# Patient Record
Sex: Male | Born: 1965 | Race: White | Hispanic: No | Marital: Single | Smoking: Current every day smoker
Health system: Southern US, Community
[De-identification: ages and names within clinical notes are randomized; demographics above are authoritative.]

## PROBLEM LIST (undated history)

## (undated) DIAGNOSIS — D696 Thrombocytopenia, unspecified: Secondary | ICD-10-CM

## (undated) DIAGNOSIS — K750 Abscess of liver: Secondary | ICD-10-CM

## (undated) DIAGNOSIS — K5730 Diverticulosis of large intestine without perforation or abscess without bleeding: Secondary | ICD-10-CM

## (undated) DIAGNOSIS — N1330 Unspecified hydronephrosis: Secondary | ICD-10-CM

## (undated) HISTORY — PX: ANTERIOR CRUCIATE LIGAMENT REPAIR: SHX115

## (undated) HISTORY — PX: OTHER SURGICAL HISTORY: SHX169

## (undated) HISTORY — PX: EXPLORATORY LAPAROTOMY: SUR591

---

## 2004-07-25 ENCOUNTER — Emergency Department (HOSPITAL_COMMUNITY): Admission: EM | Admit: 2004-07-25 | Discharge: 2004-07-25 | Payer: Self-pay | Admitting: *Deleted

## 2004-09-07 ENCOUNTER — Ambulatory Visit: Payer: Self-pay | Admitting: Family Medicine

## 2004-09-16 ENCOUNTER — Ambulatory Visit: Payer: Self-pay | Admitting: Professional

## 2004-09-23 ENCOUNTER — Ambulatory Visit: Payer: Self-pay | Admitting: Professional

## 2004-09-30 ENCOUNTER — Ambulatory Visit: Payer: Self-pay | Admitting: Professional

## 2004-10-07 ENCOUNTER — Ambulatory Visit: Payer: Self-pay | Admitting: Professional

## 2004-12-13 ENCOUNTER — Ambulatory Visit: Payer: Self-pay | Admitting: Professional

## 2004-12-17 ENCOUNTER — Ambulatory Visit: Payer: Self-pay | Admitting: Family Medicine

## 2004-12-31 ENCOUNTER — Ambulatory Visit (HOSPITAL_COMMUNITY): Admission: RE | Admit: 2004-12-31 | Discharge: 2004-12-31 | Payer: Self-pay | Admitting: Orthopedic Surgery

## 2004-12-31 ENCOUNTER — Ambulatory Visit (HOSPITAL_BASED_OUTPATIENT_CLINIC_OR_DEPARTMENT_OTHER): Admission: RE | Admit: 2004-12-31 | Discharge: 2004-12-31 | Payer: Self-pay | Admitting: Orthopedic Surgery

## 2005-01-17 ENCOUNTER — Encounter: Admission: RE | Admit: 2005-01-17 | Discharge: 2005-04-17 | Payer: Self-pay | Admitting: Orthopedic Surgery

## 2005-01-27 ENCOUNTER — Ambulatory Visit: Payer: Self-pay | Admitting: Professional

## 2005-02-03 ENCOUNTER — Ambulatory Visit: Payer: Self-pay | Admitting: Professional

## 2005-02-14 ENCOUNTER — Ambulatory Visit: Payer: Self-pay | Admitting: Professional

## 2005-03-10 ENCOUNTER — Ambulatory Visit: Payer: Self-pay | Admitting: Professional

## 2005-04-18 ENCOUNTER — Encounter: Admission: RE | Admit: 2005-04-18 | Discharge: 2005-07-17 | Payer: Self-pay | Admitting: Orthopedic Surgery

## 2005-04-28 ENCOUNTER — Ambulatory Visit: Payer: Self-pay | Admitting: Professional

## 2005-07-11 ENCOUNTER — Ambulatory Visit: Payer: Self-pay | Admitting: Professional

## 2005-08-15 HISTORY — PX: ORIF FEMUR FRACTURE: SHX2119

## 2006-03-05 ENCOUNTER — Inpatient Hospital Stay (HOSPITAL_COMMUNITY): Admission: EM | Admit: 2006-03-05 | Discharge: 2006-03-08 | Payer: Self-pay | Admitting: Emergency Medicine

## 2006-03-22 ENCOUNTER — Encounter: Admission: RE | Admit: 2006-03-22 | Discharge: 2006-03-29 | Payer: Self-pay | Admitting: Orthopedic Surgery

## 2006-05-15 ENCOUNTER — Encounter: Admission: RE | Admit: 2006-05-15 | Discharge: 2006-05-23 | Payer: Self-pay | Admitting: Orthopedic Surgery

## 2006-07-03 ENCOUNTER — Encounter: Admission: RE | Admit: 2006-07-03 | Discharge: 2006-07-27 | Payer: Self-pay | Admitting: Orthopedic Surgery

## 2015-09-16 DIAGNOSIS — D696 Thrombocytopenia, unspecified: Secondary | ICD-10-CM

## 2015-09-16 DIAGNOSIS — K750 Abscess of liver: Secondary | ICD-10-CM

## 2015-09-16 DIAGNOSIS — K5730 Diverticulosis of large intestine without perforation or abscess without bleeding: Secondary | ICD-10-CM

## 2015-09-16 HISTORY — DX: Thrombocytopenia, unspecified: D69.6

## 2015-09-16 HISTORY — DX: Diverticulosis of large intestine without perforation or abscess without bleeding: K57.30

## 2015-09-16 HISTORY — DX: Abscess of liver: K75.0

## 2015-10-08 ENCOUNTER — Encounter (HOSPITAL_BASED_OUTPATIENT_CLINIC_OR_DEPARTMENT_OTHER): Payer: Self-pay | Admitting: *Deleted

## 2015-10-08 ENCOUNTER — Inpatient Hospital Stay (HOSPITAL_BASED_OUTPATIENT_CLINIC_OR_DEPARTMENT_OTHER)
Admission: EM | Admit: 2015-10-08 | Discharge: 2015-10-14 | DRG: 871 | Disposition: A | Discharge status: Home Health | Payer: Commercial Managed Care - PPO | Attending: Internal Medicine | Admitting: Internal Medicine

## 2015-10-08 ENCOUNTER — Emergency Department (HOSPITAL_BASED_OUTPATIENT_CLINIC_OR_DEPARTMENT_OTHER): Payer: Commercial Managed Care - PPO

## 2015-10-08 DIAGNOSIS — F1010 Alcohol abuse, uncomplicated: Secondary | ICD-10-CM | POA: Diagnosis present

## 2015-10-08 DIAGNOSIS — B957 Other staphylococcus as the cause of diseases classified elsewhere: Secondary | ICD-10-CM | POA: Diagnosis not present

## 2015-10-08 DIAGNOSIS — R6883 Chills (without fever): Secondary | ICD-10-CM | POA: Diagnosis not present

## 2015-10-08 DIAGNOSIS — K6389 Other specified diseases of intestine: Secondary | ICD-10-CM

## 2015-10-08 DIAGNOSIS — R5383 Other fatigue: Secondary | ICD-10-CM

## 2015-10-08 DIAGNOSIS — E876 Hypokalemia: Secondary | ICD-10-CM | POA: Diagnosis present

## 2015-10-08 DIAGNOSIS — R059 Cough, unspecified: Secondary | ICD-10-CM | POA: Insufficient documentation

## 2015-10-08 DIAGNOSIS — B370 Candidal stomatitis: Secondary | ICD-10-CM | POA: Diagnosis present

## 2015-10-08 DIAGNOSIS — A409 Streptococcal sepsis, unspecified: Principal | ICD-10-CM | POA: Diagnosis present

## 2015-10-08 DIAGNOSIS — R05 Cough: Secondary | ICD-10-CM | POA: Diagnosis not present

## 2015-10-08 DIAGNOSIS — R935 Abnormal findings on diagnostic imaging of other abdominal regions, including retroperitoneum: Secondary | ICD-10-CM | POA: Diagnosis not present

## 2015-10-08 DIAGNOSIS — Z113 Encounter for screening for infections with a predominantly sexual mode of transmission: Secondary | ICD-10-CM | POA: Diagnosis not present

## 2015-10-08 DIAGNOSIS — D120 Benign neoplasm of cecum: Secondary | ICD-10-CM | POA: Diagnosis present

## 2015-10-08 DIAGNOSIS — Z6827 Body mass index (BMI) 27.0-27.9, adult: Secondary | ICD-10-CM

## 2015-10-08 DIAGNOSIS — R918 Other nonspecific abnormal finding of lung field: Secondary | ICD-10-CM | POA: Diagnosis present

## 2015-10-08 DIAGNOSIS — F17200 Nicotine dependence, unspecified, uncomplicated: Secondary | ICD-10-CM | POA: Diagnosis present

## 2015-10-08 DIAGNOSIS — E8809 Other disorders of plasma-protein metabolism, not elsewhere classified: Secondary | ICD-10-CM | POA: Diagnosis present

## 2015-10-08 DIAGNOSIS — R634 Abnormal weight loss: Secondary | ICD-10-CM | POA: Insufficient documentation

## 2015-10-08 DIAGNOSIS — D696 Thrombocytopenia, unspecified: Secondary | ICD-10-CM | POA: Diagnosis present

## 2015-10-08 DIAGNOSIS — D122 Benign neoplasm of ascending colon: Secondary | ICD-10-CM | POA: Diagnosis present

## 2015-10-08 DIAGNOSIS — K5732 Diverticulitis of large intestine without perforation or abscess without bleeding: Secondary | ICD-10-CM | POA: Diagnosis present

## 2015-10-08 DIAGNOSIS — B954 Other streptococcus as the cause of diseases classified elsewhere: Secondary | ICD-10-CM | POA: Diagnosis not present

## 2015-10-08 DIAGNOSIS — Z720 Tobacco use: Secondary | ICD-10-CM | POA: Diagnosis present

## 2015-10-08 DIAGNOSIS — J189 Pneumonia, unspecified organism: Secondary | ICD-10-CM | POA: Diagnosis present

## 2015-10-08 DIAGNOSIS — K5730 Diverticulosis of large intestine without perforation or abscess without bleeding: Secondary | ICD-10-CM | POA: Diagnosis not present

## 2015-10-08 DIAGNOSIS — E871 Hypo-osmolality and hyponatremia: Secondary | ICD-10-CM | POA: Diagnosis present

## 2015-10-08 DIAGNOSIS — N1330 Unspecified hydronephrosis: Secondary | ICD-10-CM | POA: Diagnosis present

## 2015-10-08 DIAGNOSIS — K750 Abscess of liver: Secondary | ICD-10-CM | POA: Diagnosis present

## 2015-10-08 HISTORY — DX: Abscess of liver: K75.0

## 2015-10-08 HISTORY — DX: Diverticulosis of large intestine without perforation or abscess without bleeding: K57.30

## 2015-10-08 HISTORY — DX: Unspecified hydronephrosis: N13.30

## 2015-10-08 HISTORY — DX: Thrombocytopenia, unspecified: D69.6

## 2015-10-08 LAB — COMPREHENSIVE METABOLIC PANEL
ALT: 33 U/L (ref 17–63)
AST: 55 U/L — AB (ref 15–41)
Albumin: 2.2 g/dL — ABNORMAL LOW (ref 3.5–5.0)
Alkaline Phosphatase: 140 U/L — ABNORMAL HIGH (ref 38–126)
Anion gap: 12 (ref 5–15)
BUN: 12 mg/dL (ref 6–20)
CALCIUM: 7.7 mg/dL — AB (ref 8.9–10.3)
CO2: 24 mmol/L (ref 22–32)
Chloride: 92 mmol/L — ABNORMAL LOW (ref 101–111)
Creatinine, Ser: 0.75 mg/dL (ref 0.61–1.24)
GFR calc Af Amer: >60 mL/min (ref >60)
GFR calc non Af Amer: >60 mL/min (ref >60)
Glucose, Bld: 161 mg/dL — ABNORMAL HIGH (ref 65–99)
Potassium: 2.7 mmol/L — CL (ref 3.5–5.1)
Sodium: 128 mmol/L — ABNORMAL LOW (ref 135–145)
Total Bilirubin: 2.2 mg/dL — ABNORMAL HIGH (ref 0.3–1.2)
Total Protein: 7 g/dL (ref 6.5–8.1)

## 2015-10-08 LAB — CBC WITH DIFFERENTIAL/PLATELET
BASOS PCT: 0 %
Basophils Absolute: 0 10*3/uL (ref 0.0–0.1)
EOS PCT: 1 %
Eosinophils Absolute: 0.1 10*3/uL (ref 0.0–0.7)
HCT: 38.1 % — ABNORMAL LOW (ref 39.0–52.0)
HEMOGLOBIN: 13.6 g/dL (ref 13.0–17.0)
LYMPHS PCT: 15 %
Lymphs Abs: 1.2 10*3/uL (ref 0.7–4.0)
MCH: 34.8 pg — ABNORMAL HIGH (ref 26.0–34.0)
MCHC: 35.7 g/dL (ref 30.0–36.0)
MCV: 97.4 fL (ref 78.0–100.0)
MONO ABS: 1 10*3/uL (ref 0.1–1.0)
Monocytes Relative: 12 %
Neutro Abs: 5.8 10*3/uL (ref 1.7–7.7)
Neutrophils Relative %: 72 %
Platelets: 102 10*3/uL — ABNORMAL LOW (ref 150–400)
RBC: 3.91 MIL/uL — ABNORMAL LOW (ref 4.22–5.81)
RDW: 14.2 % (ref 11.5–15.5)
WBC: 8.1 10*3/uL (ref 4.0–10.5)

## 2015-10-08 LAB — RAPID HIV SCREEN (HIV 1/2 AB+AG)
HIV 1/2 ANTIBODIES: NONREACTIVE
HIV-1 P24 ANTIGEN - HIV24: NONREACTIVE

## 2015-10-08 LAB — URINALYSIS, ROUTINE W REFLEX MICROSCOPIC
Bilirubin Urine: NEGATIVE
Glucose, UA: NEGATIVE mg/dL
Hgb urine dipstick: NEGATIVE
Ketones, ur: NEGATIVE mg/dL
Leukocytes, UA: NEGATIVE
Nitrite: NEGATIVE
Protein, ur: NEGATIVE mg/dL
SPECIFIC GRAVITY, URINE: 1.015 (ref 1.005–1.030)
pH: 7 (ref 5.0–8.0)

## 2015-10-08 LAB — I-STAT CG4 LACTIC ACID, ED
Lactic Acid, Venous: 2.95 mmol/L (ref 0.5–2.0)
Lactic Acid, Venous: 1.59 mmol/L (ref 0.5–2.0)

## 2015-10-08 LAB — CBG MONITORING, ED: Glucose-Capillary: 130 mg/dL — ABNORMAL HIGH (ref 65–99)

## 2015-10-08 MED ORDER — ACETAMINOPHEN 500 MG PO TABS
1000.0000 mg | ORAL_TABLET | Freq: Once | ORAL | Status: AC
  Administered 2015-10-08: 1000 mg via ORAL
  Filled 2015-10-08: qty 2

## 2015-10-08 MED ORDER — SODIUM CHLORIDE 0.9 % IV BOLUS (SEPSIS)
1000.0000 mL | Freq: Once | INTRAVENOUS | Status: AC
  Administered 2015-10-08: 1000 mL via INTRAVENOUS

## 2015-10-08 MED ORDER — IBUPROFEN 800 MG PO TABS
800.0000 mg | ORAL_TABLET | Freq: Once | ORAL | Status: DC
  Filled 2015-10-08: qty 1

## 2015-10-08 MED ORDER — SODIUM CHLORIDE 0.9 % IV SOLN
1000.0000 mL | INTRAVENOUS | Status: DC
  Administered 2015-10-08 – 2015-10-10 (×4): 1000 mL via INTRAVENOUS

## 2015-10-08 MED ORDER — IOHEXOL 300 MG/ML  SOLN
100.0000 mL | Freq: Once | INTRAMUSCULAR | Status: AC | PRN
  Administered 2015-10-08: 100 mL via INTRAVENOUS

## 2015-10-08 MED ORDER — CEFTRIAXONE SODIUM 1 G IJ SOLR
1.0000 g | INTRAMUSCULAR | Status: DC
  Administered 2015-10-09: 1 g via INTRAVENOUS
  Filled 2015-10-08: qty 10

## 2015-10-08 MED ORDER — ONDANSETRON HCL 4 MG PO TABS: 4.0000 mg | ORAL_TABLET | Freq: Four times a day (QID) | ORAL | Status: DC | PRN

## 2015-10-08 MED ORDER — IBUPROFEN 800 MG PO TABS
800.0000 mg | ORAL_TABLET | Freq: Once | ORAL | Status: AC
  Administered 2015-10-08: 800 mg via ORAL

## 2015-10-08 MED ORDER — DEXTROSE 5 % IV SOLN
500.0000 mg | Freq: Once | INTRAVENOUS | Status: AC
  Administered 2015-10-08: 500 mg via INTRAVENOUS

## 2015-10-08 MED ORDER — POTASSIUM CHLORIDE CRYS ER 20 MEQ PO TBCR
30.0000 meq | EXTENDED_RELEASE_TABLET | Freq: Three times a day (TID) | ORAL | Status: AC
  Administered 2015-10-08 – 2015-10-09 (×4): 30 meq via ORAL
  Filled 2015-10-08 (×4): qty 1

## 2015-10-08 MED ORDER — METRONIDAZOLE IN NACL 5-0.79 MG/ML-% IV SOLN
500.0000 mg | Freq: Three times a day (TID) | INTRAVENOUS | Status: DC
  Administered 2015-10-08 – 2015-10-12 (×12): 500 mg via INTRAVENOUS
  Filled 2015-10-08 (×13): qty 100

## 2015-10-08 MED ORDER — ACETAMINOPHEN 325 MG PO TABS: 650.0000 mg | ORAL_TABLET | Freq: Four times a day (QID) | ORAL | Status: DC | PRN

## 2015-10-08 MED ORDER — ENSURE ENLIVE PO LIQD
237.0000 mL | Freq: Two times a day (BID) | ORAL | Status: DC
  Administered 2015-10-10 – 2015-10-11 (×3): 237 mL via ORAL

## 2015-10-08 MED ORDER — ACETAMINOPHEN 650 MG RE SUPP: 650.0000 mg | Freq: Four times a day (QID) | RECTAL | Status: DC | PRN

## 2015-10-08 MED ORDER — ENOXAPARIN SODIUM 40 MG/0.4ML ~~LOC~~ SOLN: 40.0000 mg | SUBCUTANEOUS | Status: DC

## 2015-10-08 MED ORDER — SODIUM CHLORIDE 0.9 % IV BOLUS (SEPSIS): 1000.0000 mL | Freq: Once | INTRAVENOUS | Status: DC

## 2015-10-08 MED ORDER — DEXTROSE 5 % IV SOLN: 500.0000 mg | INTRAVENOUS | Status: DC

## 2015-10-08 MED ORDER — POTASSIUM CHLORIDE 10 MEQ/100ML IV SOLN
10.0000 meq | INTRAVENOUS | Status: AC
  Administered 2015-10-08 (×3): 10 meq via INTRAVENOUS
  Filled 2015-10-08 (×3): qty 100

## 2015-10-08 MED ORDER — SODIUM CHLORIDE 0.9 % IV SOLN
INTRAVENOUS | Status: AC
  Administered 2015-10-08: 20:00:00 via INTRAVENOUS

## 2015-10-08 MED ORDER — METRONIDAZOLE IN NACL 5-0.79 MG/ML-% IV SOLN
500.0000 mg | Freq: Once | INTRAVENOUS | Status: AC
  Administered 2015-10-08: 500 mg via INTRAVENOUS
  Filled 2015-10-08: qty 100

## 2015-10-08 MED ORDER — DEXTROSE 5 % IV SOLN
1.0000 g | Freq: Once | INTRAVENOUS | Status: AC
  Administered 2015-10-08: 1 g via INTRAVENOUS
  Filled 2015-10-08: qty 10

## 2015-10-08 MED ORDER — POTASSIUM CHLORIDE CRYS ER 20 MEQ PO TBCR
40.0000 meq | EXTENDED_RELEASE_TABLET | Freq: Once | ORAL | Status: AC
  Administered 2015-10-08: 40 meq via ORAL
  Filled 2015-10-08: qty 2

## 2015-10-08 MED ORDER — IOHEXOL 300 MG/ML  SOLN
25.0000 mL | Freq: Once | INTRAMUSCULAR | Status: AC | PRN
  Administered 2015-10-08: 25 mL via ORAL

## 2015-10-08 MED ORDER — ONDANSETRON HCL 4 MG/2ML IJ SOLN: 4.0000 mg | Freq: Four times a day (QID) | INTRAMUSCULAR | Status: DC | PRN

## 2015-10-08 MED ORDER — SODIUM CHLORIDE 0.9 % IV SOLN
1000.0000 mL | INTRAVENOUS | Status: DC
  Administered 2015-10-08 (×2): 1000 mL via INTRAVENOUS

## 2015-10-08 MED ORDER — AZITHROMYCIN 500 MG IV SOLR
INTRAVENOUS | Status: AC
  Filled 2015-10-08: qty 500

## 2015-10-08 NOTE — H&P (Signed)
[facility] History and Physical  Greg Cook [id number] DOB: May 06, 1966 DOA: 10/08/2015  Referring physician: Dr. York Cerise Ward PCP: No primary care provider on file.   Chief Complaint: Cough, wt loss, gen weakness  HPI: Greg Cook is a 50 y.o. male with no specific PMH, hasn't seen a doctor for years , presented with cough, weakness, loss of appetite and losing weight over the last 3 weeks. Patient provides the history. Patient was fine until around the time of the Super Bowl , about 2.5 weeks ago.  He became ill with nausea and vomiting for a day, then had diarrhea for about 3-4 days.  No bloody stool or fevers initially.  The n/v/d resolved and he thought he would be getting better but he began to feel very tired during the day, a symptom which would come and go.  This went on for the next week or so and has progressively gotten worse, the fatigue.  He developed a cough less than 1 week ago and also has had several episodes of chills over the past 5-7 days.  He denies purulent sputum production, abd pain, bloody stool, high fevers.    CT abd/chest done which is showing a large complex liver lesion in the right lobe. Poss abscess, mets cannot be excluded.  Colon wall thickening in left colon, diverticulitis possibly but no surrounding inflammation, other possibility is neoplasm.  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  R hydro may be chronic, as there are no inflamm changes.     ROS  no rash  no HA  no blurred visoin  no sore throat or dysphagia  not eating much  not having BM's as not eating  no diarrhea now, no n/v  Where does patient live home Can patient participate in ADLs? Yes   Past Medical History History reviewed. No pertinent past medical history. Past Surgical History History reviewed. No pertinent past surgical history. Family History History reviewed. No pertinent family history. Social History  reports that he has been smoking.  He does not  have any smokeless tobacco history on file. His alcohol and drug histories are not on file. Allergies No Known Allergies Home medications Prior to Admission medications   Not on File   Liver Function Tests  Recent Labs Lab 10/08/15 0929  AST 55*  ALT 33  ALKPHOS 140*  BILITOT 2.2*  PROT 7.0  ALBUMIN 2.2*   No results for input(s): LIPASE, AMYLASE in the last 168 hours. CBC  Recent Labs Lab 10/08/15 0929  WBC 8.1  NEUTROABS 5.8  HGB 13.6  HCT 38.1*  MCV 97.4  PLT [id number]*   Basic Metabolic Panel  Recent Labs Lab 10/08/15 0929  NA 128*  K 2.7*  CL 92*  CO2 24  GLUCOSE 161*  BUN 12  CREATININE 0.75  CALCIUM 7.7*     Filed Vitals:   10/08/15 1700 10/08/15 1736 10/08/15 1800 10/08/15 2038  BP: 143/77 156/91 166/79 105/62  Pulse: 128 156 148 122  Temp:  100.2 F (37.9 C)  98.6 F (37 C)  TempSrc:  Oral  Oral  Resp: 23 32 23 18  Height:      Weight:      SpO2: 92% 93% 97% 96%   Exam: Alert, diaphoretic, cool skin to touch, not in distress No rash, cyanosis or gangrene Sclera anicteric, throat clear No jvd or nodes Chest clear bilat no rales/ no bronchial BS RRR no murmur , rub or gallop Abd  soft ntnd no mass or ascites +bs GU normal male penis/ testes MS no joint effusion Ext no edema, ulcers, or wounds Neuro is alert, nf ox 3  EKG (independently reviewed) >  CXR (independently reviewed) > faint infiltrate LUL , rounded and small CT chest > two-3 rounded lesions in left lung, one lower lobe, other larger translucent lesion mid-upper left lung CT abd > complex multicystic liver lesion in post R lobe of liver  Na 128  K 2.7  Cl 92  CO2 24  BUN 12  CReat 0.75   Alb 2.2  AST 55^/ ALT 33  Tbili 2.2   WBC 8k  Hb 13  plt 102 UA negative  Assessment: 1 Cough/ weight loss/ chills - 20 day illness w n/v/d initially then progressive fatigue and now cough/ chills. Has large liver lesion looks like an abscess, and smaller pulm lesions, not typical for PNA.   Has rec'd IV abx (rocephin/ zmax/ flagyl), will continue. Have asked ID to see, they will see in am.  May need liver aspirate.  2 Hypokalemia 3 Hyponatremia 4 Vol depletion - has not been eating 5 Hypoalbuminemia 6 ^LFT's (AST/ Tbili mildlly ^) - prob related to liver lesions seen on CT  Plan - as above   DVT Prophylaxis lovenox  Code Status: full  Family Communication:  None here  Disposition Plan: home when better    Sol Blazing [facility] Pager [phone]  Cell [phone]  If 7PM-7AM, please contact night-coverage [email or web address] Password [facility] 10/08/2015, 8:56 PM

## 2015-10-08 NOTE — ED Notes (Signed)
Via [facility]--spoke with Greg Cook 

## 2015-10-08 NOTE — Progress Notes (Signed)
Pharmacy Antibiotic Note Greg Cook is a 50 y.o. male admitted on 10/08/2015 with concern for CAP (CTA px).  Pharmacy has been consulted for Ceftriaxone and azithromycin dosing.  Plan: 1. Ceftriaxone 1 gram IV q 24 hours 2. Azithromycin 500 mg IV q 24 hours  Height: 5\' 8"  (172.7 cm) Weight: 184 lb (83.462 kg) IBW/kg (Calculated) : 68.4  Temp (24hrs), Avg:99.5 F (37.5 C), Min:99.2 F (37.3 C), Max:99.8 F (37.7 C)   Recent Labs Lab 10/08/15 0929 10/08/15 1006  WBC 8.1  --   CREATININE 0.75  --   LATICACIDVEN  --  2.95*    Estimated Creatinine Clearance: 118.8 mL/min (by C-G formula based on Cr of 0.75).    No Known Allergies  Antimicrobials this admission: 2/23 Ceftriaxone >>  2/23 Azithromycin  >>   Dose adjustments this admission: n/a  Microbiology results: px  Thank you for allowing pharmacy to be a part of this patient's care.  Vincenza Hews, PharmD, BCPS 10/08/2015, 10:47 AM Pager: [phone]

## 2015-10-08 NOTE — ED Provider Notes (Signed)
CSN: [id number]     Arrival date & time 10/08/15  0904 History   First MD Initiated Contact with Patient 10/08/15 0914     Chief Complaint  Patient presents with  . Cough     (Consider location/radiation/quality/duration/timing/severity/associated sxs/prior Treatment) Patient is a 50 y.o. male presenting with cough. The history is provided by the patient and medical records. No language interpreter was used.  Cough Associated symptoms: no chest pain, no chills, no fever, no myalgias, no rash and no sore throat    Greg Cook is a 50 y.o. male  with no pertinent PMH who presents to the Emergency Department with main complaint of fatigue and decreased appetite x 1 month. Patient states that he had n/v/d symptoms at the beginning of February which resolved after a few days, however ever since that time, he has felt weak and does not feel like he has gotten over the illness. Dry cough has lingered throughout the month. Patient denies fever at home (99.2 in triage). No medications taken PTA. No alleviating or aggravating factors. Patient states he has been drinking plenty of water and is much more thirsty than usual. Denies chest pain, abdominal pain, n/v/d at this time.    History reviewed. No pertinent past medical history. History reviewed. No pertinent past surgical history. History reviewed. No pertinent family history. Social History  Substance Use Topics  . Smoking status: Current Every Day Smoker  . Smokeless tobacco: None  . Alcohol Use: None    Review of Systems  Constitutional: Positive for appetite change and fatigue. Negative for fever and chills.  HENT: Positive for congestion. Negative for sore throat.   Eyes: Negative for visual disturbance.  Respiratory: Positive for cough.   Cardiovascular: Negative for chest pain.  Gastrointestinal: Negative for nausea, vomiting, abdominal pain and diarrhea.  Genitourinary: Negative for dysuria.  Musculoskeletal: Negative for  myalgias and back pain.  Skin: Negative for rash.  Neurological: Positive for weakness.      Allergies  Review of patient's allergies indicates no known allergies.  Home Medications   Prior to Admission medications   Not on File   BP 124/82 mmHg  Pulse 113  Temp(Src) 99.8 F (37.7 C) (Oral)  Resp 16  Ht 5' 8" (1.727 m)  Wt 83.462 kg  BMI 27.98 kg/m2  SpO2 97% Physical Exam  Constitutional: He is oriented to person, place, and time. He appears well-developed and well-nourished.  Alert, ill-appearing, NAD  HENT:  Head: Normocephalic and atraumatic.  OP with white plaque c/w thrush Tacky mucus membranes.   Neck: Normal range of motion. Neck supple.  Cardiovascular: Normal rate, regular rhythm and normal heart sounds.  Exam reveals no gallop and no friction rub.   No murmur heard. Pulmonary/Chest: Effort normal and breath sounds normal. No respiratory distress. He has no wheezes. He has no rales.  Abdominal: Soft. Bowel sounds are normal. He exhibits no distension and no mass. There is no tenderness. There is no rebound and no guarding.  Musculoskeletal: He exhibits no edema.  Lymphadenopathy:    He has no cervical adenopathy.  Neurological: He is alert and oriented to person, place, and time.  Skin: Skin is warm and dry. No rash noted.  Delayed cap refill.   Psychiatric: He has a normal mood and affect. His behavior is normal. Judgment and thought content normal.  Nursing note and vitals reviewed.   ED Course  Procedures (including critical care time) Labs Review Labs Reviewed  COMPREHENSIVE METABOLIC PANEL -  Abnormal; Notable for the following:    Sodium 128 (*)    Potassium 2.7 (*)    Chloride 92 (*)    Glucose, Bld 161 (*)    Calcium 7.7 (*)    Albumin 2.2 (*)    AST 55 (*)    Alkaline Phosphatase 140 (*)    Total Bilirubin 2.2 (*)    All other components within normal limits  CBC WITH DIFFERENTIAL/PLATELET - Abnormal; Notable for the following:    RBC  3.91 (*)    HCT 38.1 (*)    MCH 34.8 (*)    Platelets 102 (*)    All other components within normal limits  CBG MONITORING, ED - Abnormal; Notable for the following:    Glucose-Capillary 130 (*)    All other components within normal limits  I-STAT CG4 LACTIC ACID, ED - Abnormal; Notable for the following:    Lactic Acid, Venous 2.95 (*)    All other components within normal limits  CULTURE, BLOOD (ROUTINE X 2)  CULTURE, BLOOD (ROUTINE X 2)  URINE CULTURE  RAPID HIV SCREEN (HIV 1/2 AB+AG)  URINALYSIS, ROUTINE W REFLEX MICROSCOPIC (NOT AT [facility])  HIV ANTIBODY (ROUTINE TESTING)  I-STAT CG4 LACTIC ACID, ED    Imaging Review Dg Chest 2 View  10/08/2015  CLINICAL DATA:  Cough and congestion since 09/16/2015, vomiting and diarrhea 3 weeks ago but none since, fatigue, tired beginning, ongoing cough, decreased appetite, smoker EXAM: CHEST  2 VIEW COMPARISON:  03/05/2006 FINDINGS: Normal heart size, mediastinal contours and pulmonary vascularity. Central peribronchial thickening. Opacity in LEFT upper lobe question mass versus infiltrate. Linear subsegmental atelectasis RIGHT base. Remaining lungs clear. No pleural effusion or pneumothorax. Bones unremarkable. IMPRESSION: LEFT upper lobe opacity question mass versus focal infiltrate, malignancy not excluded; CT chest with contrast recommended to exclude tumor. Electronically Signed   By: Lavonia Dana M.D.   On: 10/08/2015 09:51   Ct Chest W Contrast  10/08/2015  CLINICAL DATA:  50 year old with cough and congestion. Fatigue and decreased appetite. Opacity in left upper lobe on recent chest radiograph. EXAM: CT CHEST, ABDOMEN, AND PELVIS WITH CONTRAST TECHNIQUE: Multidetector CT imaging of the chest, abdomen and pelvis was performed following the standard protocol during bolus administration of intravenous contrast. CONTRAST:  6m OMNIPAQUE IOHEXOL 300 MG/ML SOLN, 1050mOMNIPAQUE IOHEXOL 300 MG/ML SOLN COMPARISON:  Chest radiograph 10/08/2015 FINDINGS:  CT CHEST FINDINGS Mediastinum/Lymph Nodes: Left anterior descending coronary artery is heavily calcified. There is no significant pericardial fluid. Small lymph nodes throughout the mediastinum. Overall, no significant chest lymphadenopathy. No axillary lymphadenopathy. Normal caliber of the thoracic aorta. Lungs/Pleura: The trachea and mainstem bronchi are patent. There is a focal irregular opacity at the lingula measured 2.7 cm on sequence 4, image 47. Few subtle peripheral densities in left upper lobe. Focal parenchymal densities along the bronchovascular distribution in the left upper lobe. This lesion accounts for the recent chest radiograph finding. There is also a subtle pleural-based opacity in the superior segment of left lower lobe on sequence 4, image 28. Musculoskeletal: No acute bone abnormality. No suspicious bone findings. CT ABDOMEN PELVIS FINDINGS Hepatobiliary: There are multiple low-density structures along the posterior medial aspect of the right hepatic lobe. In addition, there is low-density in the surrounding right hepatic lobe. Difficult to measure this process but it roughly measures 5.9 x 6.6 x 8.1 cm. Normal appearance the left hepatic lobe. Main portal venous system is patent. Normal appearance of the gallbladder. Pancreas: Normal appearance of pancreas without inflammation  or duct dilatation. Spleen: Normal appearance of spleen without enlargement. Adrenals/Urinary Tract: Normal adrenal glands. 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 There is mild-moderate dilatation of the proximal right ureters. There is mild dilatation of the distal right ureter. No evidence for an obstructing stone. Small fluid in the urinary bladder without gross abnormality. No acute inflammatory changes involving the kidneys. Stomach/Bowel: There is oral contrast in the small bowel and colon. There is extensive diverticulosis involving the left colon. Asymmetric wall thickening involving the proximal  sigmoid colon on sequence 2, image 97. In addition, there is focal wall thickening in the left colon at the junction of the sigmoid colon and descending colon on sequence 2, image 89 that measures up to 2.0 cm. No significant pericolonic inflammation. No acute abnormality to the stomach or small bowel. Vascular/Lymphatic: Few atherosclerotic calcifications of the abdominal aorta without aneurysm. Plaque involving the external iliac arteries bilaterally. There is no significant abdominal or pelvic lymphadenopathy. Small retroperitoneal lymph nodes along the left side of the aorta. Reproductive: Calcifications in the prostate without significant enlargement. No free fluid. No free air. Small periumbilical hernia containing fat. Other: Intramedullary nail with screw fixation in the proximal right femur. No suspicious bone findings. Musculoskeletal:  No suspicious bone lesions identified. IMPRESSION: There are scattered opacities in the left lung, largest in the left upper lobe. Based on the distribution of disease, favor an infectious etiology. However, the rounded opacity in the lingula also raises concern for a neoplastic process. Recommend follow-up after antibiotic therapy to ensure resolution of these lung findings. Large complex lesion involving the right hepatic lobe. This lesion is multiloculated with low-density or cystic areas. Findings are concerning for a complex hepatic abscess. A neoplastic process cannot be excluded. The extent of this liver lesion could be better characterized with MRI, with and without contrast. Areas of asymmetric wall thickening involving the left colon at the junction of the descending colon and sigmoid colon. Findings could represent areas of diverticulitis but there is no significant pericolonic inflammation. Colonic neoplasms are also in the differential diagnosis. 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 There is also mild dilatation to the  distal right ureter. The right hydronephrosis could be chronic because there is no significant inflammatory changes in this area. However, a distal right ureter lesion cannot be excluded. These results were called by telephone at the time of interpretation on 10/08/2015 at 12:04 pm to Dr. Darl Householder , who verbally acknowledged these results. Electronically Signed   By: Markus Daft M.D.   On: 10/08/2015 12:13   Ct Abdomen Pelvis W Contrast  10/08/2015  CLINICAL DATA:  50 year old with cough and congestion. Fatigue and decreased appetite. Opacity in left upper lobe on recent chest radiograph. EXAM: CT CHEST, ABDOMEN, AND PELVIS WITH CONTRAST TECHNIQUE: Multidetector CT imaging of the chest, abdomen and pelvis was performed following the standard protocol during bolus administration of intravenous contrast. CONTRAST:  37m OMNIPAQUE IOHEXOL 300 MG/ML SOLN, 1037mOMNIPAQUE IOHEXOL 300 MG/ML SOLN COMPARISON:  Chest radiograph 10/08/2015 FINDINGS: CT CHEST FINDINGS Mediastinum/Lymph Nodes: Left anterior descending coronary artery is heavily calcified. There is no significant pericardial fluid. Small lymph nodes throughout the mediastinum. Overall, no significant chest lymphadenopathy. No axillary lymphadenopathy. Normal caliber of the thoracic aorta. Lungs/Pleura: The trachea and mainstem bronchi are patent. There is a focal irregular opacity at the lingula measured 2.7 cm on sequence 4, image 47. Few subtle peripheral densities in left upper lobe. Focal parenchymal densities along the  bronchovascular distribution in the left upper lobe. This lesion accounts for the recent chest radiograph finding. There is also a subtle pleural-based opacity in the superior segment of left lower lobe on sequence 4, image 28. Musculoskeletal: No acute bone abnormality. No suspicious bone findings. CT ABDOMEN PELVIS FINDINGS Hepatobiliary: There are multiple low-density structures along the posterior medial aspect of the right hepatic lobe. In  addition, there is low-density in the surrounding right hepatic lobe. Difficult to measure this process but it roughly measures 5.9 x 6.6 x 8.1 cm. Normal appearance the left hepatic lobe. Main portal venous system is patent. Normal appearance of the gallbladder. Pancreas: Normal appearance of pancreas without inflammation or duct dilatation. Spleen: Normal appearance of spleen without enlargement. Adrenals/Urinary Tract: Normal adrenal glands. 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 There is mild-moderate dilatation of the proximal right ureters. There is mild dilatation of the distal right ureter. No evidence for an obstructing stone. Small fluid in the urinary bladder without gross abnormality. No acute inflammatory changes involving the kidneys. Stomach/Bowel: There is oral contrast in the small bowel and colon. There is extensive diverticulosis involving the left colon. Asymmetric wall thickening involving the proximal sigmoid colon on sequence 2, image 97. In addition, there is focal wall thickening in the left colon at the junction of the sigmoid colon and descending colon on sequence 2, image 89 that measures up to 2.0 cm. No significant pericolonic inflammation. No acute abnormality to the stomach or small bowel. Vascular/Lymphatic: Few atherosclerotic calcifications of the abdominal aorta without aneurysm. Plaque involving the external iliac arteries bilaterally. There is no significant abdominal or pelvic lymphadenopathy. Small retroperitoneal lymph nodes along the left side of the aorta. Reproductive: Calcifications in the prostate without significant enlargement. No free fluid. No free air. Small periumbilical hernia containing fat. Other: Intramedullary nail with screw fixation in the proximal right femur. No suspicious bone findings. Musculoskeletal:  No suspicious bone lesions identified. IMPRESSION: There are scattered opacities in the left lung, largest in the left upper lobe. Based on  the distribution of disease, favor an infectious etiology. However, the rounded opacity in the lingula also raises concern for a neoplastic process. Recommend follow-up after antibiotic therapy to ensure resolution of these lung findings. Large complex lesion involving the right hepatic lobe. This lesion is multiloculated with low-density or cystic areas. Findings are concerning for a complex hepatic abscess. A neoplastic process cannot be excluded. The extent of this liver lesion could be better characterized with MRI, with and without contrast. Areas of asymmetric wall thickening involving the left colon at the junction of the descending colon and sigmoid colon. Findings could represent areas of diverticulitis but there is no significant pericolonic inflammation. Colonic neoplasms are also in the differential diagnosis. 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 There is also mild dilatation to the distal right ureter. The right hydronephrosis could be chronic because there is no significant inflammatory changes in this area. However, a distal right ureter lesion cannot be excluded. These results were called by telephone at the time of interpretation on 10/08/2015 at 12:04 pm to Dr. Darl Householder , who verbally acknowledged these results. Electronically Signed   By: Markus Daft M.D.   On: 10/08/2015 12:13   I have personally reviewed and evaluated these images and lab results as part of my medical decision-making.   EKG Interpretation   Date/Time:  Thursday October 08 2015 10:17:33 EST Ventricular Rate:  117 PR Interval:  151 QRS Duration:  101 QT Interval:  327 QTC Calculation: 456 R Axis:   52 Text Interpretation:  Sinus tachycardia No previous ECGs available  Confirmed by YAO  MD, DAVID ([id number]) on 10/08/2015 11:38:42 AM      MDM   Final diagnoses:  Cough  Weight loss  CAP (community acquired pneumonia)  Colonic mass   Greg Cook presents with one month history of  decreased appetite, cough, weight loss. At initial eval, patient was tachy and dry on exam, fluids started.   Labs: CBC with normal white count, lactic 2.95, CMP with k+ 2.7, AST 55, Alk phos 140, bili 2.2; ALT wdl at 33 Rapid HIV performed which was negative, antibody sent. Blood cultures obtained prior to starting ABX.  Imaging: CXR shows left upper lobe opacity will obtain CT CT shows scattered opacities in left lung which favor infectious etiology, neoplastic process still not ruled out.  Large lesion found in right hepatic lobe abscess vs. Neoplastic - MRI w/ and w/out recommended for further eval. Areas of asymmetric wall thickening in left colon also found, diverticulitis vs. Neoplasm.   Therapeutics: Fluids, rocephin and azithro started initially, flagyl added after CT abdomen results returned, potassium replacement.   A&P: Consult to medicine at [facility] who will admit for further evaluation and management.   Patient seen by and discussed with Dr. Darl Householder who agrees with treatment plan.   [facility] Ward, PA-C 10/08/15 1302  Wandra Arthurs, MD 10/08/15 [id number]

## 2015-10-08 NOTE — ED Notes (Signed)
Pt and mother informed of npo status.

## 2015-10-08 NOTE — Progress Notes (Signed)
Patient presents with fatigue, fever, and sepsis, chest abdomen pelvis significant for multiple pulmonary opacities, most likely multi focal pneumonia(versus neoplasm), as well has liver lesion(abscess versus neoplasm), acute on Rocephin and azithromycin for CHP, Flagyl added for anaerobic coverage for liver lesion, cachectic, never seen physician before, multiple electrolytes abnormalities, not for further workup, except to telemetry given his tachycardia. [facility] Criselda Starke

## 2015-10-08 NOTE — ED Notes (Signed)
[facility] & MD at bedside.

## 2015-10-08 NOTE — ED Notes (Signed)
pa at bedside. 

## 2015-10-08 NOTE — ED Notes (Addendum)
Pt amb to room 5 with quick steady gait in nad. Pt reports cough and congestion x feb 1st. Pt had vomiting and diarrhea 3 weeks ago, none since then. He states he is here today because of fatigue, feeling tired, and ongoing cough with decreased appetite.

## 2015-10-09 ENCOUNTER — Encounter (HOSPITAL_COMMUNITY): Payer: Self-pay | Admitting: General Surgery

## 2015-10-09 ENCOUNTER — Inpatient Hospital Stay (HOSPITAL_COMMUNITY): Payer: Commercial Managed Care - PPO

## 2015-10-09 DIAGNOSIS — Z720 Tobacco use: Secondary | ICD-10-CM | POA: Diagnosis present

## 2015-10-09 DIAGNOSIS — Z113 Encounter for screening for infections with a predominantly sexual mode of transmission: Secondary | ICD-10-CM

## 2015-10-09 DIAGNOSIS — R634 Abnormal weight loss: Secondary | ICD-10-CM

## 2015-10-09 DIAGNOSIS — R918 Other nonspecific abnormal finding of lung field: Secondary | ICD-10-CM

## 2015-10-09 DIAGNOSIS — K6389 Other specified diseases of intestine: Secondary | ICD-10-CM

## 2015-10-09 DIAGNOSIS — F17200 Nicotine dependence, unspecified, uncomplicated: Secondary | ICD-10-CM | POA: Insufficient documentation

## 2015-10-09 DIAGNOSIS — J189 Pneumonia, unspecified organism: Secondary | ICD-10-CM

## 2015-10-09 DIAGNOSIS — K750 Abscess of liver: Secondary | ICD-10-CM

## 2015-10-09 LAB — COMPREHENSIVE METABOLIC PANEL
ALBUMIN: 1.8 g/dL — AB (ref 3.5–5.0)
ALT: 30 U/L (ref 17–63)
AST: 53 U/L — AB (ref 15–41)
Alkaline Phosphatase: 137 U/L — ABNORMAL HIGH (ref 38–126)
Anion gap: 11 (ref 5–15)
BUN: 5 mg/dL — ABNORMAL LOW (ref 6–20)
CHLORIDE: 104 mmol/L (ref 101–111)
CO2: 19 mmol/L — AB (ref 22–32)
CREATININE: 0.66 mg/dL (ref 0.61–1.24)
Calcium: 7.7 mg/dL — ABNORMAL LOW (ref 8.9–10.3)
GFR calc non Af Amer: >60 mL/min (ref >60)
GLUCOSE: 99 mg/dL (ref 65–99)
Potassium: 3.8 mmol/L (ref 3.5–5.1)
SODIUM: 134 mmol/L — AB (ref 135–145)
Total Bilirubin: 2.4 mg/dL — ABNORMAL HIGH (ref 0.3–1.2)
Total Protein: 6.4 g/dL — ABNORMAL LOW (ref 6.5–8.1)

## 2015-10-09 LAB — CBC WITH DIFFERENTIAL/PLATELET
Basophils Absolute: 0 10*3/uL (ref 0.0–0.1)
Basophils Relative: 0 %
EOS ABS: 0 10*3/uL (ref 0.0–0.7)
Eosinophils Relative: 0 %
HEMATOCRIT: 37.2 % — AB (ref 39.0–52.0)
HEMOGLOBIN: 12.7 g/dL — AB (ref 13.0–17.0)
LYMPHS ABS: 0.5 10*3/uL — AB (ref 0.7–4.0)
LYMPHS PCT: 6 %
MCH: 33.6 pg (ref 26.0–34.0)
MCHC: 34.1 g/dL (ref 30.0–36.0)
MCV: 98.4 fL (ref 78.0–100.0)
MONOS PCT: 6 %
Monocytes Absolute: 0.6 10*3/uL (ref 0.1–1.0)
NEUTROS ABS: 8.1 10*3/uL — AB (ref 1.7–7.7)
NEUTROS PCT: 88 %
Platelets: 92 10*3/uL — ABNORMAL LOW (ref 150–400)
RBC: 3.78 MIL/uL — AB (ref 4.22–5.81)
RDW: 14.7 % (ref 11.5–15.5)
WBC: 9.2 10*3/uL (ref 4.0–10.5)

## 2015-10-09 LAB — TYPE AND SCREEN
ABO/RH(D): A POS
ANTIBODY SCREEN: NEGATIVE

## 2015-10-09 LAB — ABO/RH: ABO/RH(D): A POS

## 2015-10-09 LAB — PROTIME-INR
INR: 1.73 — ABNORMAL HIGH (ref 0.00–1.49)
PROTHROMBIN TIME: 20.3 s — AB (ref 11.6–15.2)

## 2015-10-09 MED ORDER — MIDAZOLAM HCL 2 MG/2ML IJ SOLN
INTRAMUSCULAR | Status: AC
  Filled 2015-10-09: qty 6

## 2015-10-09 MED ORDER — ENOXAPARIN SODIUM 40 MG/0.4ML ~~LOC~~ SOLN
40.0000 mg | SUBCUTANEOUS | Status: DC
  Administered 2015-10-10: 40 mg via SUBCUTANEOUS
  Filled 2015-10-09 (×3): qty 0.4

## 2015-10-09 MED ORDER — VITAMIN B-1 100 MG PO TABS
100.0000 mg | ORAL_TABLET | Freq: Every day | ORAL | Status: DC
  Administered 2015-10-09 – 2015-10-14 (×5): 100 mg via ORAL
  Filled 2015-10-09 (×5): qty 1

## 2015-10-09 MED ORDER — ADULT MULTIVITAMIN W/MINERALS CH
1.0000 | ORAL_TABLET | Freq: Every day | ORAL | Status: DC
  Administered 2015-10-09 – 2015-10-14 (×6): 1 via ORAL
  Filled 2015-10-09 (×6): qty 1

## 2015-10-09 MED ORDER — DEXTROSE 5 % IV SOLN
2.0000 g | INTRAVENOUS | Status: DC
  Administered 2015-10-10 – 2015-10-14 (×5): 2 g via INTRAVENOUS
  Filled 2015-10-09 (×5): qty 2

## 2015-10-09 MED ORDER — SIMETHICONE 80 MG PO CHEW: 80.0000 mg | CHEWABLE_TABLET | Freq: Four times a day (QID) | ORAL | Status: DC | PRN

## 2015-10-09 MED ORDER — FENTANYL CITRATE (PF) 100 MCG/2ML IJ SOLN
INTRAMUSCULAR | Status: AC
  Filled 2015-10-09: qty 4

## 2015-10-09 MED ORDER — MIDAZOLAM HCL 2 MG/2ML IJ SOLN
INTRAMUSCULAR | Status: AC | PRN
  Administered 2015-10-09 (×3): 1 mg via INTRAVENOUS

## 2015-10-09 MED ORDER — LIDOCAINE HCL 1 % IJ SOLN
INTRAMUSCULAR | Status: AC
  Filled 2015-10-09: qty 20

## 2015-10-09 MED ORDER — SODIUM CHLORIDE 0.9 % IV SOLN: Freq: Once | INTRAVENOUS | Status: DC

## 2015-10-09 MED ORDER — VANCOMYCIN HCL IN DEXTROSE 1-5 GM/200ML-% IV SOLN
1000.0000 mg | Freq: Three times a day (TID) | INTRAVENOUS | Status: DC
  Administered 2015-10-09 – 2015-10-12 (×10): 1000 mg via INTRAVENOUS
  Filled 2015-10-09 (×12): qty 200

## 2015-10-09 MED ORDER — THIAMINE HCL 100 MG/ML IJ SOLN: 100.0000 mg | Freq: Every day | INTRAMUSCULAR | Status: DC

## 2015-10-09 MED ORDER — LORAZEPAM 2 MG/ML IJ SOLN: 1.0000 mg | Freq: Four times a day (QID) | INTRAMUSCULAR | Status: AC | PRN

## 2015-10-09 MED ORDER — TRAMADOL HCL 50 MG PO TABS
50.0000 mg | ORAL_TABLET | Freq: Four times a day (QID) | ORAL | Status: DC | PRN
  Administered 2015-10-09 – 2015-10-12 (×4): 50 mg via ORAL
  Filled 2015-10-09 (×4): qty 1

## 2015-10-09 MED ORDER — LORAZEPAM 1 MG PO TABS
1.0000 mg | ORAL_TABLET | Freq: Four times a day (QID) | ORAL | Status: AC | PRN
  Administered 2015-10-09: 1 mg via ORAL
  Filled 2015-10-09: qty 1

## 2015-10-09 MED ORDER — FENTANYL CITRATE (PF) 100 MCG/2ML IJ SOLN
INTRAMUSCULAR | Status: AC | PRN
  Administered 2015-10-09 (×4): 50 ug via INTRAVENOUS

## 2015-10-09 MED ORDER — NYSTATIN 100000 UNIT/ML MT SUSP
5.0000 mL | Freq: Four times a day (QID) | OROMUCOSAL | Status: DC
  Administered 2015-10-09 – 2015-10-14 (×18): 500000 [IU] via ORAL
  Filled 2015-10-09 (×17): qty 5

## 2015-10-09 MED ORDER — FOLIC ACID 1 MG PO TABS
1.0000 mg | ORAL_TABLET | Freq: Every day | ORAL | Status: DC
  Administered 2015-10-09 – 2015-10-14 (×6): 1 mg via ORAL
  Filled 2015-10-09 (×6): qty 1

## 2015-10-09 NOTE — Care Management Note (Signed)
Case Management Note  Patient Details  Name: Greg Cook MRN: [id number] Date of Birth: 06/22/1966  Subjective/Objective:                  Spoke with patient, he states that he does not have a PCP because he never needs to go to the doctor much. He states that he will follow up with his son's PCP in [place] after discharge. He denies any need for [facility] or DME at this time. Patient has a liver possible liver abcess and is being evaluated by IR for drainage, not sure at this time if he will be dc'd with a drain in place.    Action/Plan:  CM will continue to follow for [facility] needs.  Expected Discharge Date:                  Expected Discharge Plan:  [place]  In-House Referral:     Discharge planning Services  CM Consult  Post Acute Care Choice:    Choice offered to:     DME Arranged:    DME Agency:     HH Arranged:    [place] Agency:     Status of Service:  In process, will continue to follow  Medicare Important Message Given:    Date Medicare IM Given:    Medicare IM give by:    Date Additional Medicare IM Given:    Additional Medicare Important Message give by:     If discussed at [place] of Stay Meetings, dates discussed:    Additional Comments:  [name], RN 10/09/2015, 1:50 PM

## 2015-10-09 NOTE — Progress Notes (Signed)
Pharmacy Antibiotic Note  57 YOM with no significant PMH - hasn't seen MD in years - who presented on 2/23 with cough, weight loss, and generalized weakness. Imaging showed a large liver lesion concerning for abscess, colon wall thickening/possible neoplasm, and scattered lung opacities. The patient is being worked up for infection and/or possible cancer with mets.   Pharmacy was consulted on 2/24 AM to add Vancomycin for 1/2 BCx now growing GPC. SCr 0.66, CrCl~90-100 ml/min.   Plan: 1. Start Vancomycin 1g IV every 8 hours 2. Adjust Rocephin to 2g IV every 24 hours for intra-abdominal, abscess, and +BCx 3. Will continue to follow renal function, culture results, LOT, and antibiotic de-escalation plans   Height: 5\' 8"  (172.7 cm) Weight: 184 lb (83.462 kg) IBW/kg (Calculated) : 68.4  Temp (24hrs), Avg:99.4 F (37.4 C), Min:98.4 F (36.9 C), Max:100.7 F (38.2 C)   Recent Labs Lab 10/08/15 0929 10/08/15 1006 10/08/15 1345 10/09/15 0515  WBC 8.1  --   --  9.2  CREATININE 0.75  --   --  0.66  LATICACIDVEN  --  2.95* 1.59  --     Estimated Creatinine Clearance: 118.8 mL/min (by C-G formula based on Cr of 0.66).    No Known Allergies  Antimicrobials this admission: CTX 2/23 >> Azithro 2/23 >> 2/24 Flagyl 2/23 >> Vanc 2/24 >>  Dose adjustments this admission: n/a  Microbiology results: 2/23 BCx >> 1/2 [facility] 2/23 UCx >> 2/23 RCx >>  Thank you for allowing pharmacy to be a part of this patient's care.  Alycia Rossetti, PharmD, BCPS Clinical Pharmacist Pager: [phone] 10/09/2015 10:54 AM

## 2015-10-09 NOTE — Procedures (Signed)
Liver abscess drain 10 Fr Pus No comp/EBL

## 2015-10-09 NOTE — Progress Notes (Signed)
PROGRESS NOTE  ERCEL PEPITONE LAG:[id number] DOB: Sep 04, 1965 DOA: 10/08/2015 PCP: No primary care provider on file. Outpatient Specialists:    LOS: 1 day   Brief Narrative: Greg Cook is a 50 year old male who was admitted yesterday for sepsis-like picture, fatigue, cough and decreased appetite for the past 2-1/2 weeks. CT revealed scattered opacities of the left lung, a hepatic lesion and asymmetric thickening of the left colon wall.  Assessment & Plan: Principal Problem:   Liver abscess - by CT Active Problems:   Chills   Fatigue   Pulmonary infiltrates   Cough   Weight loss   Colonic mass   Hepatic abscess   Lung nodules   Smoker   Screen for STD (sexually transmitted disease)   Tobacco abuse    Sepsis due to liver abscess - Patient febrile, tachycardic and tachypneic on admission with CT scan concerning for liver abscess - Interventional Radiology to aspirate abscess today - will send for labs. Discussed with IR. - Patient reports no abdominal pain or tenderness. - Currently being treated with ceftriaxone, metronidazole and vancomycin for suspected abscess. - ESR and CRP pending - Blood cultures positive for gram-positive, 1 out of 2 bottles  Colonic wall thickening - Neoplastic process vs other. ? If this is colitis related with diarrheal illness at home - If liver lesion turns out not to be an abscess, will bring possibility of neoplasm higher on the list and will likely need gastroenterology evaluation - Pt reports no N/V but last BM was diarrhea.  Pulmonary infiltrates with cough - Scattered opacities of the left lung concerning for infection, but the opacity in the lingula raised concern for neoplasm. - Denies sputum production but endorses a "dry" cough for the past week. - Antibiotics should cover PNA   Elevated LFTs - AST, alk phos slightly elevated, abscess related to the liver lesion  Thrombocytopenia - unclear etiology currently, may be related to  sepsis vs alcohol abuse.  - no mentions of liver disease on imaging  Oral thrush - Noted on the buccal mucosa and oropharynx. - Patient reports no pain or discomfort - Start nystatin swish and swallow, monitor response  Alcohol abuse - Patient's mother stated that patient drinks heavily in the weekends - Patient reports drinking 6-12 beers on weekend nights and 1-2 some mornings when he gets off of work. - Unclear as to the accuracy of his drinking claims  - monitor for withdrawal, start CIWA  Elevated INR - FFP per IR during procedure - Awaiting labs for reevaluation.  Tobacco abuse   DVT prophylaxis: Lovenox   Code Status: Full Family Communication: None at bedside Disposition Plan: Home when ready Barriers for discharge: sepsis  Consultants:   ID  IR  Procedures: None  Antimicrobials: Ceftriaxone  2/23 >> Metronidazole  2/23 >> Vancomycin  2/23 >>  Subjective: Patient is awake, alert, in NAD, pleasant. Denies abdominal pain, no further nausea or vomiting  Objective: Filed Vitals:   10/08/15 2038 10/09/15 0529 10/09/15 0655 10/09/15 1259  BP: 105/62 143/77  118/84  Pulse: 122 131  118  Temp: 98.6 F (37 C) 100.7 F (38.2 C) 99.6 F (37.6 C) 100 F (37.8 C)  TempSrc: Oral Oral Oral Oral  Resp: 18 18  16   Height:      Weight:      SpO2: 96% 98%  96%    Intake/Output Summary (Last 24 hours) at 10/09/15 1314 Last data filed at 10/09/15 1252  Gross per 24 hour  Intake 3072.5 ml  Output   2270 ml  Net  802.5 ml   Filed Weights   10/08/15 1022  Weight: 83.462 kg (184 lb)    Examination: BP 118/84 mmHg  Pulse 118  Temp(Src) 100 F (37.8 C) (Oral)  Resp 16  Ht 5' 8"  (1.727 m)  Wt 83.462 kg (184 lb)  BMI 27.98 kg/m2  SpO2 96% General exam: NAD Respiratory system: Clear. No increased work of breathing. No wheezing, no crackles Cardiovascular system: regular rate and rhythm, no murmurs, gallops. No JVD. No peripheral edema.  Gastrointestinal  system: Abdomen is nondistended, soft and nontender. Normal bowel sounds heard. Central nervous system: AxOx3. No focal deficits Extremities: No clubbing/cyanosis Skin: no rashes  Data Reviewed: I have personally reviewed following labs and imaging studies  CBC:  Recent Labs Lab 10/08/15 0929 10/09/15 0515  WBC 8.1 9.2  NEUTROABS 5.8 8.1*  HGB 13.6 12.7*  HCT 38.1* 37.2*  MCV 97.4 98.4  PLT 102* 92*   Basic Metabolic Panel:  Recent Labs Lab 10/08/15 0929 10/09/15 0515  NA 128* 134*  K 2.7* 3.8  CL 92* 104  CO2 24 19*  GLUCOSE 161* 99  BUN 12 5*  CREATININE 0.75 0.66  CALCIUM 7.7* 7.7*   GFR: Estimated Creatinine Clearance: 118.8 mL/min (by C-G formula based on Cr of 0.66). Liver Function Tests:  Recent Labs Lab 10/08/15 0929 10/09/15 0515  AST 55* 53*  ALT 33 30  ALKPHOS 140* 137*  BILITOT 2.2* 2.4*  PROT 7.0 6.4*  ALBUMIN 2.2* 1.8*   Coagulation Profile:  Recent Labs Lab 10/09/15 1024  INR 1.73*   CBG:  Recent Labs Lab 10/08/15 0933  GLUCAP 130*   Urine analysis:    Component Value Date/Time   COLORURINE YELLOW 10/08/2015 1730   APPEARANCEUR CLEAR 10/08/2015 1730   LABSPEC 1.015 10/08/2015 1730   PHURINE 7.0 10/08/2015 1730   GLUCOSEU NEGATIVE 10/08/2015 1730   HGBUR NEGATIVE 10/08/2015 1730   BILIRUBINUR NEGATIVE 10/08/2015 1730   KETONESUR NEGATIVE 10/08/2015 1730   PROTEINUR NEGATIVE 10/08/2015 1730   NITRITE NEGATIVE 10/08/2015 1730   LEUKOCYTESUR NEGATIVE 10/08/2015 1730   Sepsis Labs: Invalid input(s): PROCALCITONIN, LACTICIDVEN  Recent Results (from the past 240 hour(s))  Blood Culture (routine x 2)     Status: None (Preliminary result)   Collection Time: 10/08/15 10:00 AM  Result Value Ref Range Status   Specimen Description BLOOD LEFT AC  Final   Special Requests BOTTLES DRAWN AEROBIC AND ANAEROBIC 5CC  Final   Culture  Setup Time   Final    GRAM POSITIVE COCCI IN CLUSTERS IN BOTH AEROBIC AND ANAEROBIC  BOTTLES CRITICAL RESULT CALLED TO, READ BACK BY AND VERIFIED WITH: K BRUMAGIN 10/09/15 @ 1013 M VESTAL    Culture   Final    GRAM POSITIVE COCCI Performed at [facility]    Report Status PENDING  Incomplete  Blood Culture (routine x 2)     Status: None (Preliminary result)   Collection Time: 10/08/15 10:20 AM  Result Value Ref Range Status   Specimen Description BLOOD LEFT HAND  Final   Special Requests BOTTLES DRAWN AEROBIC AND ANAEROBIC 5CC  Final   Culture PENDING  Incomplete   Report Status PENDING  Incomplete    Radiology Studies: Dg Chest 2 View  10/08/2015  CLINICAL DATA:  Cough and congestion since 09/16/2015, vomiting and diarrhea 3 weeks ago but none since, fatigue, tired beginning, ongoing cough, decreased appetite, smoker EXAM: CHEST  2 VIEW  COMPARISON:  03/05/2006 FINDINGS: Normal heart size, mediastinal contours and pulmonary vascularity. Central peribronchial thickening. Opacity in LEFT upper lobe question mass versus infiltrate. Linear subsegmental atelectasis RIGHT base. Remaining lungs clear. No pleural effusion or pneumothorax. Bones unremarkable. IMPRESSION: LEFT upper lobe opacity question mass versus focal infiltrate, malignancy not excluded; CT chest with contrast recommended to exclude tumor. Electronically Signed   By: Lavonia Dana M.D.   On: 10/08/2015 09:51   Ct Chest W Contrast  10/08/2015  CLINICAL DATA:  50 year old with cough and congestion. Fatigue and decreased appetite. Opacity in left upper lobe on recent chest radiograph. EXAM: CT CHEST, ABDOMEN, AND PELVIS WITH CONTRAST TECHNIQUE: Multidetector CT imaging of the chest, abdomen and pelvis was performed following the standard protocol during bolus administration of intravenous contrast. CONTRAST:  82m OMNIPAQUE IOHEXOL 300 MG/ML SOLN, 1027mOMNIPAQUE IOHEXOL 300 MG/ML SOLN COMPARISON:  Chest radiograph 10/08/2015 FINDINGS: CT CHEST FINDINGS Mediastinum/Lymph Nodes: Left anterior descending coronary  artery is heavily calcified. There is no significant pericardial fluid. Small lymph nodes throughout the mediastinum. Overall, no significant chest lymphadenopathy. No axillary lymphadenopathy. Normal caliber of the thoracic aorta. Lungs/Pleura: The trachea and mainstem bronchi are patent. There is a focal irregular opacity at the lingula measured 2.7 cm on sequence 4, image 47. Few subtle peripheral densities in left upper lobe. Focal parenchymal densities along the bronchovascular distribution in the left upper lobe. This lesion accounts for the recent chest radiograph finding. There is also a subtle pleural-based opacity in the superior segment of left lower lobe on sequence 4, image 28. Musculoskeletal: No acute bone abnormality. No suspicious bone findings. CT ABDOMEN PELVIS FINDINGS Hepatobiliary: There are multiple low-density structures along the posterior medial aspect of the right hepatic lobe. In addition, there is low-density in the surrounding right hepatic lobe. Difficult to measure this process but it roughly measures 5.9 x 6.6 x 8.1 cm. Normal appearance the left hepatic lobe. Main portal venous system is patent. Normal appearance of the gallbladder. Pancreas: Normal appearance of pancreas without inflammation or duct dilatation. Spleen: Normal appearance of spleen without enlargement. Adrenals/Urinary Tract: Normal adrenal glands. 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 There is mild-moderate dilatation of the proximal right ureters. There is mild dilatation of the distal right ureter. No evidence for an obstructing stone. Small fluid in the urinary bladder without gross abnormality. No acute inflammatory changes involving the kidneys. Stomach/Bowel: There is oral contrast in the small bowel and colon. There is extensive diverticulosis involving the left colon. Asymmetric wall thickening involving the proximal sigmoid colon on sequence 2, image 97. In addition, there is focal wall  thickening in the left colon at the junction of the sigmoid colon and descending colon on sequence 2, image 89 that measures up to 2.0 cm. No significant pericolonic inflammation. No acute abnormality to the stomach or small bowel. Vascular/Lymphatic: Few atherosclerotic calcifications of the abdominal aorta without aneurysm. Plaque involving the external iliac arteries bilaterally. There is no significant abdominal or pelvic lymphadenopathy. Small retroperitoneal lymph nodes along the left side of the aorta. Reproductive: Calcifications in the prostate without significant enlargement. No free fluid. No free air. Small periumbilical hernia containing fat. Other: Intramedullary nail with screw fixation in the proximal right femur. No suspicious bone findings. Musculoskeletal:  No suspicious bone lesions identified. IMPRESSION: There are scattered opacities in the left lung, largest in the left upper lobe. Based on the distribution of disease, favor an infectious etiology. However, the rounded opacity in the lingula also raises concern  for a neoplastic process. Recommend follow-up after antibiotic therapy to ensure resolution of these lung findings. Large complex lesion involving the right hepatic lobe. This lesion is multiloculated with low-density or cystic areas. Findings are concerning for a complex hepatic abscess. A neoplastic process cannot be excluded. The extent of this liver lesion could be better characterized with MRI, with and without contrast. Areas of asymmetric wall thickening involving the left colon at the junction of the descending colon and sigmoid colon. Findings could represent areas of diverticulitis but there is no significant pericolonic inflammation. Colonic neoplasms are also in the differential diagnosis. 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 There is also mild dilatation to the distal right ureter. The right hydronephrosis could be chronic because there  is no significant inflammatory changes in this area. However, a distal right ureter lesion cannot be excluded. These results were called by telephone at the time of interpretation on 10/08/2015 at 12:04 pm to Dr. Darl Householder , who verbally acknowledged these results. Electronically Signed   By: Markus Daft M.D.   On: 10/08/2015 12:13   Ct Abdomen Pelvis W Contrast  10/08/2015  CLINICAL DATA:  50 year old with cough and congestion. Fatigue and decreased appetite. Opacity in left upper lobe on recent chest radiograph. EXAM: CT CHEST, ABDOMEN, AND PELVIS WITH CONTRAST TECHNIQUE: Multidetector CT imaging of the chest, abdomen and pelvis was performed following the standard protocol during bolus administration of intravenous contrast. CONTRAST:  67m OMNIPAQUE IOHEXOL 300 MG/ML SOLN, 1039mOMNIPAQUE IOHEXOL 300 MG/ML SOLN COMPARISON:  Chest radiograph 10/08/2015 FINDINGS: CT CHEST FINDINGS Mediastinum/Lymph Nodes: Left anterior descending coronary artery is heavily calcified. There is no significant pericardial fluid. Small lymph nodes throughout the mediastinum. Overall, no significant chest lymphadenopathy. No axillary lymphadenopathy. Normal caliber of the thoracic aorta. Lungs/Pleura: The trachea and mainstem bronchi are patent. There is a focal irregular opacity at the lingula measured 2.7 cm on sequence 4, image 47. Few subtle peripheral densities in left upper lobe. Focal parenchymal densities along the bronchovascular distribution in the left upper lobe. This lesion accounts for the recent chest radiograph finding. There is also a subtle pleural-based opacity in the superior segment of left lower lobe on sequence 4, image 28. Musculoskeletal: No acute bone abnormality. No suspicious bone findings. CT ABDOMEN PELVIS FINDINGS Hepatobiliary: There are multiple low-density structures along the posterior medial aspect of the right hepatic lobe. In addition, there is low-density in the surrounding right hepatic lobe.  Difficult to measure this process but it roughly measures 5.9 x 6.6 x 8.1 cm. Normal appearance the left hepatic lobe. Main portal venous system is patent. Normal appearance of the gallbladder. Pancreas: Normal appearance of pancreas without inflammation or duct dilatation. Spleen: Normal appearance of spleen without enlargement. Adrenals/Urinary Tract: Normal adrenal glands. 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 There is mild-moderate dilatation of the proximal right ureters. There is mild dilatation of the distal right ureter. No evidence for an obstructing stone. Small fluid in the urinary bladder without gross abnormality. No acute inflammatory changes involving the kidneys. Stomach/Bowel: There is oral contrast in the small bowel and colon. There is extensive diverticulosis involving the left colon. Asymmetric wall thickening involving the proximal sigmoid colon on sequence 2, image 97. In addition, there is focal wall thickening in the left colon at the junction of the sigmoid colon and descending colon on sequence 2, image 89 that measures up to 2.0 cm. No significant pericolonic inflammation. No acute abnormality to the stomach  or small bowel. Vascular/Lymphatic: Few atherosclerotic calcifications of the abdominal aorta without aneurysm. Plaque involving the external iliac arteries bilaterally. There is no significant abdominal or pelvic lymphadenopathy. Small retroperitoneal lymph nodes along the left side of the aorta. Reproductive: Calcifications in the prostate without significant enlargement. No free fluid. No free air. Small periumbilical hernia containing fat. Other: Intramedullary nail with screw fixation in the proximal right femur. No suspicious bone findings. Musculoskeletal:  No suspicious bone lesions identified. IMPRESSION: There are scattered opacities in the left lung, largest in the left upper lobe. Based on the distribution of disease, favor an infectious etiology. However,  the rounded opacity in the lingula also raises concern for a neoplastic process. Recommend follow-up after antibiotic therapy to ensure resolution of these lung findings. Large complex lesion involving the right hepatic lobe. This lesion is multiloculated with low-density or cystic areas. Findings are concerning for a complex hepatic abscess. A neoplastic process cannot be excluded. The extent of this liver lesion could be better characterized with MRI, with and without contrast. Areas of asymmetric wall thickening involving the left colon at the junction of the descending colon and sigmoid colon. Findings could represent areas of diverticulitis but there is no significant pericolonic inflammation. Colonic neoplasms are also in the differential diagnosis. 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 There is also mild dilatation to the distal right ureter. The right hydronephrosis could be chronic because there is no significant inflammatory changes in this area. However, a distal right ureter lesion cannot be excluded. These results were called by telephone at the time of interpretation on 10/08/2015 at 12:04 pm to Dr. Darl Householder , who verbally acknowledged these results. Electronically Signed   By: Markus Daft M.D.   On: 10/08/2015 12:13     Scheduled Meds: . sodium chloride   Intravenous Once  . [START ON 10/10/2015] cefTRIAXone (ROCEPHIN)  IV  2 g Intravenous Q24H  . [START ON 10/10/2015] enoxaparin (LOVENOX) injection  40 mg Subcutaneous Q24H  . feeding supplement (ENSURE ENLIVE)  237 mL Oral BID BM  . metronidazole  500 mg Intravenous 3 times per day  . potassium chloride  30 mEq Oral TID  . vancomycin  1,000 mg Intravenous Q8H   Continuous Infusions: . sodium chloride 1,000 mL (10/09/15 0418)     Marzetta Board, MD, PhD [facility] Pager [phone]  If 7PM-7AM, please contact night-coverage [email or web address] Password [email or web address] 10/09/2015, 1:14 PM

## 2015-10-09 NOTE — Progress Notes (Signed)
NURSING PROGRESS NOTE  Greg Cook JZ:[id number] Admission Data: 10/09/2015 1:44 AM Attending Provider: Roney Jaffe, MD PCP:No primary care provider on file. Code Status: Full  Allergies:  Review of patient's allergies indicates no known allergies. Past Medical History:   has no past medical history on file. Past Surgical History:   has no past surgical history on file. Social History:   reports that he has been smoking.  He does not have any smokeless tobacco history on file.  Greg Cook is a 50 y.o. male patient admitted from ED:   Last Documented Vital Signs: Blood pressure 105/62, pulse 122, temperature 98.6 F (37 C), temperature source Oral, resp. rate 18, height 5\' 8"  (1.727 m), weight 83.462 kg (184 lb), SpO2 96 %.  Cardiac Monitoring: Box # 9 in place. Cardiac monitor yields:sinus tachycardia.  IV Fluids:  IV in place, occlusive dsg intact without redness, IV cath forearm left, condition patent and no redness normal saline; IV cath [facility] right, condition patent and no redness, normal saline locked.   Skin: Appropriate for ethnicity and intact  Patient/Family orientated to room. Information packet given to patient/family. Admission inpatient armband information verified with patient/family to include name and date of birth and placed on patient arm. Side rails up x 2, fall assessment and education completed with patient/family. Patient/family able to verbalize understanding of risk associated with falls and verbalized understanding to call for assistance before getting out of bed. Call light within reach. Patient/family able to voice and demonstrate understanding of unit orientation instructions.    Will continue to evaluate and treat per MD orders.  Clydell Hakim RN, BSN

## 2015-10-09 NOTE — Consult Note (Signed)
Chief Complaint: liver fluid collection  Referring Physician:Dr. Costin Gherghe  Hoss, Arthur  HPI: BLANDON OFFERDAHL is an 50 y.o. male who 3 weeks ago developed diarrhea for about a week.  It then improved, but he developed malaise and fatigue.  He had SOB with exertion which was new.  His appetite decreased and he was only able to eat several bites of food before getting full.  He noticed some chills at home, but never took his temperature.  He denies ever having any pain whether in his chest or abdomen.  He presented to the ED where he had a CT c/a/p.  This revealed scattered opacities of the left lung concerning for infection, but the opacity in the lingula raised concern for neoplasm.  He also has a large complex lesion in his right hepatic lobe, concerning for infectious vs cyst.  We have been asked to see the patient for aspiration and possible drain placement in the liver lesion.   Past Medical History: History reviewed. No pertinent past medical history.  Past Surgical History:  Past Surgical History  Procedure Laterality Date  . Exploratory laparotomy      for SBO at age 26    Family History: History reviewed. No pertinent family history.  Social History:  reports that he has been smoking.  He does not have any smokeless tobacco history on file. His alcohol and drug histories are not on file.  Allergies: No Known Allergies  Medications:   Medication List    ASK your doctor about these medications        GOODYS EXTRA STRENGTH 500-325-65 MG Pack  Generic drug:  Aspirin-Acetaminophen-Caffeine  Take 1 packet by mouth 2 (two) times daily as needed. For pain     ibuprofen 200 MG tablet  Commonly known as:  ADVIL,MOTRIN  Take 400 mg by mouth every 6 (six) hours as needed.        Please HPI for pertinent positives, otherwise complete 10 system ROS negative.  Mallampati Score: MD Evaluation Airway: WNL Heart: WNL Abdomen: WNL Chest/ Lungs: WNL ASA   Classification: 2 Mallampati/Airway Score: Two  Physical Exam: BP 143/77 mmHg  Pulse 131  Temp(Src) 99.6 F (37.6 C) (Oral)  Resp 18  Ht 5' 8"  (1.727 m)  Wt 184 lb (83.462 kg)  BMI 27.98 kg/m2  SpO2 98% Body mass index is 27.98 kg/(m^2). General: pleasant, WD, WN white male who is sitting up in his chair in NAD HEENT: head is normocephalic, atraumatic.  Sclera are noninjected.  PERRL.  Ears and nose without any masses or lesions, [place] in place.  Mouth is pink and moist Heart: regular, rate, and rhythm.  Normal s1,s2. No obvious murmurs, gallops, or rubs noted.  Palpable radial and pedal pulses bilaterally Lungs: CTAB, no wheezes, rhonchi, or rales noted.  Respiratory effort nonlabored Abd: soft, NT, ND, +BS, no masses or organomegaly, reducible umbilical hernia MS: all 4 extremities are symmetrical with no cyanosis, clubbing, or edema. Skin: warm and dry with no masses, lesions, or rashes Psych: A&Ox3 with an appropriate affect.   Labs: Results for orders placed or performed during the hospital encounter of 10/08/15 (from the past 48 hour(s))  Comprehensive metabolic panel     Status: Abnormal   Collection Time: 10/08/15  9:29 AM  Result Value Ref Range   Sodium 128 (L) 135 - 145 mmol/L   Potassium 2.7 (LL) 3.5 - 5.1 mmol/L    Comment: CRITICAL RESULT CALLED TO, READ BACK BY AND  VERIFIED WITH: CRITICAL RESULT GIVEN TO A.[name] RN AT 2979 ON [id number] BY S.ROY REPEATED TO VERIFY    Chloride 92 (L) 101 - 111 mmol/L   CO2 24 22 - 32 mmol/L   Glucose, Bld 161 (H) 65 - 99 mg/dL   BUN 12 6 - 20 mg/dL   Creatinine, Ser 0.75 0.61 - 1.24 mg/dL   Calcium 7.7 (L) 8.9 - 10.3 mg/dL   Total Protein 7.0 6.5 - 8.1 g/dL   Albumin 2.2 (L) 3.5 - 5.0 g/dL   AST 55 (H) 15 - 41 U/L   ALT 33 17 - 63 U/L   Alkaline Phosphatase 140 (H) 38 - 126 U/L   Total Bilirubin 2.2 (H) 0.3 - 1.2 mg/dL   GFR calc non Af Amer >60 >60 mL/min   GFR calc Af Amer >60 >60 mL/min    Comment: (NOTE) The eGFR has been  calculated using the CKD EPI equation. This calculation has not been validated in all clinical situations. eGFR's persistently <60 mL/min signify possible Chronic Kidney Disease.    Anion gap 12 5 - 15  CBC with Differential     Status: Abnormal   Collection Time: 10/08/15  9:29 AM  Result Value Ref Range   WBC 8.1 4.0 - 10.5 K/uL   RBC 3.91 (L) 4.22 - 5.81 MIL/uL   Hemoglobin 13.6 13.0 - 17.0 g/dL   HCT 38.1 (L) 39.0 - 52.0 %   MCV 97.4 78.0 - 100.0 fL   MCH 34.8 (H) 26.0 - 34.0 pg   MCHC 35.7 30.0 - 36.0 g/dL   RDW 14.2 11.5 - 15.5 %   Platelets 102 (L) 150 - 400 K/uL    Comment: PLATELET COUNT CONFIRMED BY SMEAR SPECIMEN CHECKED FOR CLOTS    Neutrophils Relative % 72 %   Lymphocytes Relative 15 %   Monocytes Relative 12 %   Eosinophils Relative 1 %   Basophils Relative 0 %   Neutro Abs 5.8 1.7 - 7.7 K/uL   Lymphs Abs 1.2 0.7 - 4.0 K/uL   Monocytes Absolute 1.0 0.1 - 1.0 K/uL   Eosinophils Absolute 0.1 0.0 - 0.7 K/uL   Basophils Absolute 0.0 0.0 - 0.1 K/uL   Smear Review PLATELET COUNT CONFIRMED BY SMEAR   Rapid HIV screen (HIV 1/2 Ab+Ag)     Status: None   Collection Time: 10/08/15  9:29 AM  Result Value Ref Range   HIV-1 P24 Antigen - HIV24 NON REACTIVE NON REACTIVE   HIV 1/2 Antibodies NON REACTIVE NON REACTIVE   Interpretation (HIV Ag Ab)      A non reactive test result means that HIV 1 or HIV 2 antibodies and HIV 1 p24 antigen were not detected in the specimen.  CBG monitoring, ED     Status: Abnormal   Collection Time: 10/08/15  9:33 AM  Result Value Ref Range   Glucose-Capillary 130 (H) 65 - 99 mg/dL  Blood Culture (routine x 2)     Status: None (Preliminary result)   Collection Time: 10/08/15 10:00 AM  Result Value Ref Range   Specimen Description BLOOD LEFT AC    Special Requests BOTTLES DRAWN AEROBIC AND ANAEROBIC 5CC    Culture  Setup Time      GRAM POSITIVE COCCI IN CLUSTERS IN BOTH AEROBIC AND ANAEROBIC BOTTLES CRITICAL RESULT CALLED TO, READ BACK BY  AND VERIFIED WITH: K BRUMAGIN 10/09/15 @ 8 M VESTAL    Culture      GRAM POSITIVE COCCI Performed at  [facility]    Report Status PENDING   I-Stat CG4 Lactic Acid, ED  (not at  [facility])     Status: Abnormal   Collection Time: 10/08/15 10:06 AM  Result Value Ref Range   Lactic Acid, Venous 2.95 (HH) 0.5 - 2.0 mmol/L   Comment NOTIFIED PHYSICIAN   Blood Culture (routine x 2)     Status: None (Preliminary result)   Collection Time: 10/08/15 10:20 AM  Result Value Ref Range   Specimen Description BLOOD LEFT HAND    Special Requests BOTTLES DRAWN AEROBIC AND ANAEROBIC 5CC    Culture PENDING    Report Status PENDING   I-Stat CG4 Lactic Acid, ED  (not at  [facility])     Status: None   Collection Time: 10/08/15  1:45 PM  Result Value Ref Range   Lactic Acid, Venous 1.59 0.5 - 2.0 mmol/L  Urinalysis, Routine w reflex microscopic     Status: None   Collection Time: 10/08/15  5:30 PM  Result Value Ref Range   Color, Urine YELLOW YELLOW   APPearance CLEAR CLEAR   Specific Gravity, Urine 1.015 1.005 - 1.030   pH 7.0 5.0 - 8.0   Glucose, UA NEGATIVE NEGATIVE mg/dL   Hgb urine dipstick NEGATIVE NEGATIVE   Bilirubin Urine NEGATIVE NEGATIVE   Ketones, ur NEGATIVE NEGATIVE mg/dL   Protein, ur NEGATIVE NEGATIVE mg/dL   Nitrite NEGATIVE NEGATIVE   Leukocytes, UA NEGATIVE NEGATIVE    Comment: MICROSCOPIC NOT DONE ON URINES WITH NEGATIVE PROTEIN, BLOOD, LEUKOCYTES, NITRITE, OR GLUCOSE <1000 mg/dL.  Comprehensive metabolic panel     Status: Abnormal   Collection Time: 10/09/15  5:15 AM  Result Value Ref Range   Sodium 134 (L) 135 - 145 mmol/L   Potassium 3.8 3.5 - 5.1 mmol/L   Chloride 104 101 - 111 mmol/L   CO2 19 (L) 22 - 32 mmol/L   Glucose, Bld 99 65 - 99 mg/dL   BUN 5 (L) 6 - 20 mg/dL   Creatinine, Ser 0.66 0.61 - 1.24 mg/dL   Calcium 7.7 (L) 8.9 - 10.3 mg/dL   Total Protein 6.4 (L) 6.5 - 8.1 g/dL   Albumin 1.8 (L) 3.5 - 5.0 g/dL   AST 53 (H) 15 - 41 U/L   ALT 30 17 - 63 U/L    Alkaline Phosphatase 137 (H) 38 - 126 U/L   Total Bilirubin 2.4 (H) 0.3 - 1.2 mg/dL   GFR calc non Af Amer >60 >60 mL/min   GFR calc Af Amer >60 >60 mL/min    Comment: (NOTE) The eGFR has been calculated using the CKD EPI equation. This calculation has not been validated in all clinical situations. eGFR's persistently <60 mL/min signify possible Chronic Kidney Disease.    Anion gap 11 5 - 15  CBC with Differential/Platelet     Status: Abnormal   Collection Time: 10/09/15  5:15 AM  Result Value Ref Range   WBC 9.2 4.0 - 10.5 K/uL   RBC 3.78 (L) 4.22 - 5.81 MIL/uL   Hemoglobin 12.7 (L) 13.0 - 17.0 g/dL   HCT 37.2 (L) 39.0 - 52.0 %   MCV 98.4 78.0 - 100.0 fL   MCH 33.6 26.0 - 34.0 pg   MCHC 34.1 30.0 - 36.0 g/dL   RDW 14.7 11.5 - 15.5 %   Platelets 92 (L) 150 - 400 K/uL    Comment: SPECIMEN CHECKED FOR CLOTS PLATELET COUNT CONFIRMED BY SMEAR    Neutrophils Relative % 88 %  Neutro Abs 8.1 (H) 1.7 - 7.7 K/uL   Lymphocytes Relative 6 %   Lymphs Abs 0.5 (L) 0.7 - 4.0 K/uL   Monocytes Relative 6 %   Monocytes Absolute 0.6 0.1 - 1.0 K/uL   Eosinophils Relative 0 %   Eosinophils Absolute 0.0 0.0 - 0.7 K/uL   Basophils Relative 0 %   Basophils Absolute 0.0 0.0 - 0.1 K/uL  Protime-INR     Status: Abnormal   Collection Time: 10/09/15 10:24 AM  Result Value Ref Range   Prothrombin Time 20.3 (H) 11.6 - 15.2 seconds   INR 1.73 (H) 0.00 - 1.49    Imaging: Dg Chest 2 View  10/08/2015  CLINICAL DATA:  Cough and congestion since 09/16/2015, vomiting and diarrhea 3 weeks ago but none since, fatigue, tired beginning, ongoing cough, decreased appetite, smoker EXAM: CHEST  2 VIEW COMPARISON:  03/05/2006 FINDINGS: Normal heart size, mediastinal contours and pulmonary vascularity. Central peribronchial thickening. Opacity in LEFT upper lobe question mass versus infiltrate. Linear subsegmental atelectasis RIGHT base. Remaining lungs clear. No pleural effusion or pneumothorax. Bones unremarkable.  IMPRESSION: LEFT upper lobe opacity question mass versus focal infiltrate, malignancy not excluded; CT chest with contrast recommended to exclude tumor. Electronically Signed   By: Lavonia Dana M.D.   On: 10/08/2015 09:51   Ct Chest W Contrast  10/08/2015  CLINICAL DATA:  50 year old with cough and congestion. Fatigue and decreased appetite. Opacity in left upper lobe on recent chest radiograph. EXAM: CT CHEST, ABDOMEN, AND PELVIS WITH CONTRAST TECHNIQUE: Multidetector CT imaging of the chest, abdomen and pelvis was performed following the standard protocol during bolus administration of intravenous contrast. CONTRAST:  64m OMNIPAQUE IOHEXOL 300 MG/ML SOLN, 1089mOMNIPAQUE IOHEXOL 300 MG/ML SOLN COMPARISON:  Chest radiograph 10/08/2015 FINDINGS: CT CHEST FINDINGS Mediastinum/Lymph Nodes: Left anterior descending coronary artery is heavily calcified. There is no significant pericardial fluid. Small lymph nodes throughout the mediastinum. Overall, no significant chest lymphadenopathy. No axillary lymphadenopathy. Normal caliber of the thoracic aorta. Lungs/Pleura: The trachea and mainstem bronchi are patent. There is a focal irregular opacity at the lingula measured 2.7 cm on sequence 4, image 47. Few subtle peripheral densities in left upper lobe. Focal parenchymal densities along the bronchovascular distribution in the left upper lobe. This lesion accounts for the recent chest radiograph finding. There is also a subtle pleural-based opacity in the superior segment of left lower lobe on sequence 4, image 28. Musculoskeletal: No acute bone abnormality. No suspicious bone findings. CT ABDOMEN PELVIS FINDINGS Hepatobiliary: There are multiple low-density structures along the posterior medial aspect of the right hepatic lobe. In addition, there is low-density in the surrounding right hepatic lobe. Difficult to measure this process but it roughly measures 5.9 x 6.6 x 8.1 cm. Normal appearance the left hepatic lobe.  Main portal venous system is patent. Normal appearance of the gallbladder. Pancreas: Normal appearance of pancreas without inflammation or duct dilatation. Spleen: Normal appearance of spleen without enlargement. Adrenals/Urinary Tract: Normal adrenal glands. 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 There is mild-moderate dilatation of the proximal right ureters. There is mild dilatation of the distal right ureter. No evidence for an obstructing stone. Small fluid in the urinary bladder without gross abnormality. No acute inflammatory changes involving the kidneys. Stomach/Bowel: There is oral contrast in the small bowel and colon. There is extensive diverticulosis involving the left colon. Asymmetric wall thickening involving the proximal sigmoid colon on sequence 2, image 97. In addition, there is focal wall thickening in the  left colon at the junction of the sigmoid colon and descending colon on sequence 2, image 89 that measures up to 2.0 cm. No significant pericolonic inflammation. No acute abnormality to the stomach or small bowel. Vascular/Lymphatic: Few atherosclerotic calcifications of the abdominal aorta without aneurysm. Plaque involving the external iliac arteries bilaterally. There is no significant abdominal or pelvic lymphadenopathy. Small retroperitoneal lymph nodes along the left side of the aorta. Reproductive: Calcifications in the prostate without significant enlargement. No free fluid. No free air. Small periumbilical hernia containing fat. Other: Intramedullary nail with screw fixation in the proximal right femur. No suspicious bone findings. Musculoskeletal:  No suspicious bone lesions identified. IMPRESSION: There are scattered opacities in the left lung, largest in the left upper lobe. Based on the distribution of disease, favor an infectious etiology. However, the rounded opacity in the lingula also raises concern for a neoplastic process. Recommend follow-up after antibiotic  therapy to ensure resolution of these lung findings. Large complex lesion involving the right hepatic lobe. This lesion is multiloculated with low-density or cystic areas. Findings are concerning for a complex hepatic abscess. A neoplastic process cannot be excluded. The extent of this liver lesion could be better characterized with MRI, with and without contrast. Areas of asymmetric wall thickening involving the left colon at the junction of the descending colon and sigmoid colon. Findings could represent areas of diverticulitis but there is no significant pericolonic inflammation. Colonic neoplasms are also in the differential diagnosis. 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 There is also mild dilatation to the distal right ureter. The right hydronephrosis could be chronic because there is no significant inflammatory changes in this area. However, a distal right ureter lesion cannot be excluded. These results were called by telephone at the time of interpretation on 10/08/2015 at 12:04 pm to Dr. Darl Householder , who verbally acknowledged these results. Electronically Signed   By: Markus Daft M.D.   On: 10/08/2015 12:13   Ct Abdomen Pelvis W Contrast  10/08/2015  CLINICAL DATA:  50 year old with cough and congestion. Fatigue and decreased appetite. Opacity in left upper lobe on recent chest radiograph. EXAM: CT CHEST, ABDOMEN, AND PELVIS WITH CONTRAST TECHNIQUE: Multidetector CT imaging of the chest, abdomen and pelvis was performed following the standard protocol during bolus administration of intravenous contrast. CONTRAST:  83m OMNIPAQUE IOHEXOL 300 MG/ML SOLN, 1021mOMNIPAQUE IOHEXOL 300 MG/ML SOLN COMPARISON:  Chest radiograph 10/08/2015 FINDINGS: CT CHEST FINDINGS Mediastinum/Lymph Nodes: Left anterior descending coronary artery is heavily calcified. There is no significant pericardial fluid. Small lymph nodes throughout the mediastinum. Overall, no significant chest  lymphadenopathy. No axillary lymphadenopathy. Normal caliber of the thoracic aorta. Lungs/Pleura: The trachea and mainstem bronchi are patent. There is a focal irregular opacity at the lingula measured 2.7 cm on sequence 4, image 47. Few subtle peripheral densities in left upper lobe. Focal parenchymal densities along the bronchovascular distribution in the left upper lobe. This lesion accounts for the recent chest radiograph finding. There is also a subtle pleural-based opacity in the superior segment of left lower lobe on sequence 4, image 28. Musculoskeletal: No acute bone abnormality. No suspicious bone findings. CT ABDOMEN PELVIS FINDINGS Hepatobiliary: There are multiple low-density structures along the posterior medial aspect of the right hepatic lobe. In addition, there is low-density in the surrounding right hepatic lobe. Difficult to measure this process but it roughly measures 5.9 x 6.6 x 8.1 cm. Normal appearance the left hepatic lobe. Main portal venous system is patent. Normal  appearance of the gallbladder. Pancreas: Normal appearance of pancreas without inflammation or duct dilatation. Spleen: Normal appearance of spleen without enlargement. Adrenals/Urinary Tract: Normal adrenal glands. 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 There is mild-moderate dilatation of the proximal right ureters. There is mild dilatation of the distal right ureter. No evidence for an obstructing stone. Small fluid in the urinary bladder without gross abnormality. No acute inflammatory changes involving the kidneys. Stomach/Bowel: There is oral contrast in the small bowel and colon. There is extensive diverticulosis involving the left colon. Asymmetric wall thickening involving the proximal sigmoid colon on sequence 2, image 97. In addition, there is focal wall thickening in the left colon at the junction of the sigmoid colon and descending colon on sequence 2, image 89 that measures up to 2.0 cm. No significant  pericolonic inflammation. No acute abnormality to the stomach or small bowel. Vascular/Lymphatic: Few atherosclerotic calcifications of the abdominal aorta without aneurysm. Plaque involving the external iliac arteries bilaterally. There is no significant abdominal or pelvic lymphadenopathy. Small retroperitoneal lymph nodes along the left side of the aorta. Reproductive: Calcifications in the prostate without significant enlargement. No free fluid. No free air. Small periumbilical hernia containing fat. Other: Intramedullary nail with screw fixation in the proximal right femur. No suspicious bone findings. Musculoskeletal:  No suspicious bone lesions identified. IMPRESSION: There are scattered opacities in the left lung, largest in the left upper lobe. Based on the distribution of disease, favor an infectious etiology. However, the rounded opacity in the lingula also raises concern for a neoplastic process. Recommend follow-up after antibiotic therapy to ensure resolution of these lung findings. Large complex lesion involving the right hepatic lobe. This lesion is multiloculated with low-density or cystic areas. Findings are concerning for a complex hepatic abscess. A neoplastic process cannot be excluded. The extent of this liver lesion could be better characterized with MRI, with and without contrast. Areas of asymmetric wall thickening involving the left colon at the junction of the descending colon and sigmoid colon. Findings could represent areas of diverticulitis but there is no significant pericolonic inflammation. Colonic neoplasms are also in the differential diagnosis. 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 There is also mild dilatation to the distal right ureter. The right hydronephrosis could be chronic because there is no significant inflammatory changes in this area. However, a distal right ureter lesion cannot be excluded. These results were called by telephone at  the time of interpretation on 10/08/2015 at 12:04 pm to Dr. Darl Householder , who verbally acknowledged these results. Electronically Signed   By: Markus Daft M.D.   On: 10/08/2015 12:13    Assessment/Plan 1. Hepatic lesion, ? Abscess vs cyst -case d/w Dr. Barbie Banner, INR is 1.73.  Given he has bacteremia and other infection, we will still proceed with procedure today.  I will write for 1 unit of FFP STAT and as soon as it is hung we will call him to IR and plan on doing the procedure as the FFP is running. -cont NPO -hold lovenox -Risks and Benefits discussed with the patient including bleeding, infection, damage to adjacent structures, bowel perforation/fistula connection, and sepsis. All of the patient's questions were answered, patient is agreeable to proceed. Consent signed and in chart.   Thank you for this interesting consult.  I greatly enjoyed meeting DEVEION DENZ and look forward to participating in their care.  A copy of this report was sent to the requesting provider on this date.  Electronically Signed: Henreitta Cea 10/09/2015, 11:57 AM   I spent a total of 40 Minutes    in face to face in clinical consultation, greater than 50% of which was counseling/coordinating care for hepatic lesion

## 2015-10-09 NOTE — Consult Note (Signed)
Date of Admission:  10/08/2015  Date of Consult:  10/09/2015  Reason for Consult: Possible liver abscess versus malignancy lung lesions possible bacteremia Referring Physician: Dr. Cruzita Lederer   HPI: Greg Cook is an 50 y.o. male smoker 1ppd x 20 plus years, childhood history of viral pneumonia requiring hospitalization at [facility], and other wise not been seeing medical providers for many years. He presented to the emergency department with cough weakness loss of appetite and weight loss after the past 3 weeks.  Patient was fine until around the time of the Super Bowl , about 2.5 weeks ago. He became ill with nausea and vomiting for a day, then had diarrhea for about 3-4 days. No bloody stool or fevers initially. The n/v/d resolved and he thought he would be getting better but he began to feel very tired during the day, a symptom which would come and go. This went on for the next week or so and has progressively gotten worse, the fatigue. He developed a cough less than 1 week ago and also has had several episodes of chills over the past 5-7 days. He denies purulent sputum production, abd pain, bloody stool, high fevers.   In ED he was febrile and blood cultures were obtained.  CT scan of the abdomen and chest were done which showed:  There are scattered opacities in the left lung, largest in the left upper lobe. Based on the distribution of disease, favor an infectious etiology. However, the rounded opacity in the lingula also raises concern for a neoplastic process. Recommend follow-up after antibiotic therapy to ensure resolution of these lung findings.  Large complex lesion involving the right hepatic lobe. This lesion is multiloculated with low-density or cystic areas. Findings are concerning for a complex hepatic abscess. A neoplastic process cannot be excluded. The extent of this liver lesion could be better characterized with MRI, with and without contrast.  Areas  of asymmetric wall thickening involving the left colon at the junction of the descending colon and sigmoid colon. Findings could represent areas of diverticulitis but there is no significant pericolonic inflammation. Colonic neoplasms are also in the differential diagnosis.  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 There is also mild dilatation to the distal right ureter. The right hydronephrosis could be chronic because there is no significant inflammatory changes in this area. However, a distal right ureter lesion cannot be excluded   He was started on ceftriaxone and metronidazole. Today one of his blood cultures came back positive for gram-positive cocci in clusters and vancomycin was added.  Interventional radiology been consulted to aspirate the hepatic abscess versus malignancy.  History reviewed. No pertinent past medical history.   See above  Past Surgical History  Procedure Laterality Date  . Exploratory laparotomy      for SBO at age 44    Social History:  reports that he has been smoking.  He does not have any smokeless tobacco history on file. His alcohol and drug histories are not on file.  Travel history includes travel to [place] as well as [place] and [place]  History reviewed. No pertinent family history.  No Known Allergies   Medications: I have reviewed patients current medications as documented in Epic Anti-infectives    Start     Dose/Rate Route Frequency Ordered Stop   10/10/15 1100  cefTRIAXone (ROCEPHIN) 2 g in dextrose 5 % 50 mL IVPB  2 g 100 mL/hr over 30 Minutes Intravenous Every 24 hours 10/09/15 1045     10/09/15 1200  vancomycin (VANCOCIN) IVPB 1000 mg/200 mL premix     1,000 mg 200 mL/hr over 60 Minutes Intravenous Every 8 hours 10/09/15 1049     10/09/15 1100  cefTRIAXone (ROCEPHIN) 1 g in dextrose 5 % 50 mL IVPB  Status:  Discontinued     1 g 100 mL/hr over 30  Minutes Intravenous Every 24 hours 10/08/15 1044 10/09/15 1045   10/09/15 1100  azithromycin (ZITHROMAX) 500 mg in dextrose 5 % 250 mL IVPB  Status:  Discontinued     500 mg 250 mL/hr over 60 Minutes Intravenous Every 24 hours 10/08/15 1044 10/09/15 0823   10/08/15 2300  metroNIDAZOLE (FLAGYL) IVPB 500 mg     500 mg 100 mL/hr over 60 Minutes Intravenous 3 times per day 10/08/15 2220     10/08/15 1215  metroNIDAZOLE (FLAGYL) IVPB 500 mg     500 mg 100 mL/hr over 60 Minutes Intravenous  Once 10/08/15 1209 10/08/15 1341   10/08/15 1040  azithromycin (ZITHROMAX) 500 MG injection    Comments:  [name], [name]   : cabinet override      10/08/15 1040 10/08/15 2244   10/08/15 1030  cefTRIAXone (ROCEPHIN) 1 g in dextrose 5 % 50 mL IVPB     1 g 100 mL/hr over 30 Minutes Intravenous  Once 10/08/15 1024 10/08/15 1133   10/08/15 1030  azithromycin (ZITHROMAX) 500 mg in dextrose 5 % 250 mL IVPB     500 mg 250 mL/hr over 60 Minutes Intravenous  Once 10/08/15 1024 10/08/15 1202         ROS:  as in HPI otherwise remainder of 12 point Review of Systems is negative    Blood pressure 118/84, pulse 118, temperature 100 F (37.8 C), temperature source Oral, resp. rate 16, height _0  (1.727 m), weight 184 lb (83.462 kg), SpO2 96 %. General: Alert and awake, oriented x3, not in any acute distress. HEENT: anicteric sclera,  EOMI, oropharynx clear and without exudate Cardiovascular: regular rate, normal r,  no murmur rubs or gallops Pulmonary: clear to auscultation bilaterally, no wheezing, rales or rhonchi Gastrointestinal: Protuberant abdomen with some tenderness in the right upper quadrant positive bowel sounds and soft no rebound  Musculoskeletal: no  clubbing or edema noted bilaterally Skin, soft tissue: no rashes Neuro: nonfocal, strength and sensation intact   Results for orders placed or performed during the hospital encounter of 10/08/15 (from the past 48 hour(s))  Comprehensive metabolic  panel     Status: Abnormal   Collection Time: 10/08/15  9:29 AM  Result Value Ref Range   Sodium 128 (L) 135 - 145 mmol/L   Potassium 2.7 (LL) 3.5 - 5.1 mmol/L    Comment: CRITICAL RESULT CALLED TO, READ BACK BY AND VERIFIED WITH: CRITICAL RESULT GIVEN TO A.[name] RN AT 1103 ON [id number] BY S.ROY REPEATED TO VERIFY    Chloride 92 (L) 101 - 111 mmol/L   CO2 24 22 - 32 mmol/L   Glucose, Bld 161 (H) 65 - 99 mg/dL   BUN 12 6 - 20 mg/dL   Creatinine, Ser 0.75 0.61 - 1.24 mg/dL   Calcium 7.7 (L) 8.9 - 10.3 mg/dL   Total Protein 7.0 6.5 - 8.1 g/dL   Albumin 2.2 (L) 3.5 - 5.0 g/dL   AST 55 (H) 15 - 41 U/L   ALT 33 17 - 63 U/L   Alkaline Phosphatase  140 (H) 38 - 126 U/L   Total Bilirubin 2.2 (H) 0.3 - 1.2 mg/dL   GFR calc non Af Amer >60 >60 mL/min   GFR calc Af Amer >60 >60 mL/min    Comment: (NOTE) The eGFR has been calculated using the CKD EPI equation. This calculation has not been validated in all clinical situations. eGFR's persistently <60 mL/min signify possible Chronic Kidney Disease.    Anion gap 12 5 - 15  CBC with Differential     Status: Abnormal   Collection Time: 10/08/15  9:29 AM  Result Value Ref Range   WBC 8.1 4.0 - 10.5 K/uL   RBC 3.91 (L) 4.22 - 5.81 MIL/uL   Hemoglobin 13.6 13.0 - 17.0 g/dL   HCT 38.1 (L) 39.0 - 52.0 %   MCV 97.4 78.0 - 100.0 fL   MCH 34.8 (H) 26.0 - 34.0 pg   MCHC 35.7 30.0 - 36.0 g/dL   RDW 14.2 11.5 - 15.5 %   Platelets 102 (L) 150 - 400 K/uL    Comment: PLATELET COUNT CONFIRMED BY SMEAR SPECIMEN CHECKED FOR CLOTS    Neutrophils Relative % 72 %   Lymphocytes Relative 15 %   Monocytes Relative 12 %   Eosinophils Relative 1 %   Basophils Relative 0 %   Neutro Abs 5.8 1.7 - 7.7 K/uL   Lymphs Abs 1.2 0.7 - 4.0 K/uL   Monocytes Absolute 1.0 0.1 - 1.0 K/uL   Eosinophils Absolute 0.1 0.0 - 0.7 K/uL   Basophils Absolute 0.0 0.0 - 0.1 K/uL   Smear Review PLATELET COUNT CONFIRMED BY SMEAR   Rapid HIV screen (HIV 1/2 Ab+Ag)     Status: None     Collection Time: 10/08/15  9:29 AM  Result Value Ref Range   HIV-1 P24 Antigen - HIV24 NON REACTIVE NON REACTIVE   HIV 1/2 Antibodies NON REACTIVE NON REACTIVE   Interpretation (HIV Ag Ab)      A non reactive test result means that HIV 1 or HIV 2 antibodies and HIV 1 p24 antigen were not detected in the specimen.  CBG monitoring, ED     Status: Abnormal   Collection Time: 10/08/15  9:33 AM  Result Value Ref Range   Glucose-Capillary 130 (H) 65 - 99 mg/dL  Blood Culture (routine x 2)     Status: None (Preliminary result)   Collection Time: 10/08/15 10:00 AM  Result Value Ref Range   Specimen Description BLOOD LEFT AC    Special Requests BOTTLES DRAWN AEROBIC AND ANAEROBIC 5CC    Culture  Setup Time      GRAM POSITIVE COCCI IN CLUSTERS IN BOTH AEROBIC AND ANAEROBIC BOTTLES CRITICAL RESULT CALLED TO, READ BACK BY AND VERIFIED WITH: K BRUMAGIN 10/09/15 @ 9 M [place]ST[place]    Culture      GRAM POSITIVE COCCI Performed at [facility]    Report Status PENDING   I-Stat CG4 Lactic Acid, ED  (not at  [facility])     Status: Abnormal   Collection Time: 10/08/15 10:06 AM  Result Value Ref Range   Lactic Acid, Venous 2.95 (HH) 0.5 - 2.0 mmol/L   Comment NOTIFIED PHYSICIAN   Blood Culture (routine x 2)     Status: None (Preliminary result)   Collection Time: 10/08/15 10:20 AM  Result Value Ref Range   Specimen Description BLOOD LEFT HAND    Special Requests BOTTLES DRAWN AEROBIC AND ANAEROBIC 5CC    Culture PENDING    Report Status  PENDING   I-Stat CG4 Lactic Acid, ED  (not at  [facility])     Status: None   Collection Time: 10/08/15  1:45 PM  Result Value Ref Range   Lactic Acid, Venous 1.59 0.5 - 2.0 mmol/L  Urinalysis, Routine w reflex microscopic     Status: None   Collection Time: 10/08/15  5:30 PM  Result Value Ref Range   Color, Urine YELLOW YELLOW   APPearance CLEAR CLEAR   Specific Gravity, Urine 1.015 1.005 - 1.030   pH 7.0 5.0 - 8.0   Glucose, UA NEGATIVE NEGATIVE mg/dL   Hgb  urine dipstick NEGATIVE NEGATIVE   Bilirubin Urine NEGATIVE NEGATIVE   Ketones, ur NEGATIVE NEGATIVE mg/dL   Protein, ur NEGATIVE NEGATIVE mg/dL   Nitrite NEGATIVE NEGATIVE   Leukocytes, UA NEGATIVE NEGATIVE    Comment: MICROSCOPIC NOT DONE ON URINES WITH NEGATIVE PROTEIN, BLOOD, LEUKOCYTES, NITRITE, OR GLUCOSE <1000 mg/dL.  Comprehensive metabolic panel     Status: Abnormal   Collection Time: 10/09/15  5:15 AM  Result Value Ref Range   Sodium 134 (L) 135 - 145 mmol/L   Potassium 3.8 3.5 - 5.1 mmol/L   Chloride 104 101 - 111 mmol/L   CO2 19 (L) 22 - 32 mmol/L   Glucose, Bld 99 65 - 99 mg/dL   BUN 5 (L) 6 - 20 mg/dL   Creatinine, Ser 0.66 0.61 - 1.24 mg/dL   Calcium 7.7 (L) 8.9 - 10.3 mg/dL   Total Protein 6.4 (L) 6.5 - 8.1 g/dL   Albumin 1.8 (L) 3.5 - 5.0 g/dL   AST 53 (H) 15 - 41 U/L   ALT 30 17 - 63 U/L   Alkaline Phosphatase 137 (H) 38 - 126 U/L   Total Bilirubin 2.4 (H) 0.3 - 1.2 mg/dL   GFR calc non Af Amer >60 >60 mL/min   GFR calc Af Amer >60 >60 mL/min    Comment: (NOTE) The eGFR has been calculated using the CKD EPI equation. This calculation has not been validated in all clinical situations. eGFR's persistently <60 mL/min signify possible Chronic Kidney Disease.    Anion gap 11 5 - 15  CBC with Differential/Platelet     Status: Abnormal   Collection Time: 10/09/15  5:15 AM  Result Value Ref Range   WBC 9.2 4.0 - 10.5 K/uL   RBC 3.78 (L) 4.22 - 5.81 MIL/uL   Hemoglobin 12.7 (L) 13.0 - 17.0 g/dL   HCT 37.2 (L) 39.0 - 52.0 %   MCV 98.4 78.0 - 100.0 fL   MCH 33.6 26.0 - 34.0 pg   MCHC 34.1 30.0 - 36.0 g/dL   RDW 14.7 11.5 - 15.5 %   Platelets 92 (L) 150 - 400 K/uL    Comment: SPECIMEN CHECKED FOR CLOTS PLATELET COUNT CONFIRMED BY SMEAR    Neutrophils Relative % 88 %   Neutro Abs 8.1 (H) 1.7 - 7.7 K/uL   Lymphocytes Relative 6 %   Lymphs Abs 0.5 (L) 0.7 - 4.0 K/uL   Monocytes Relative 6 %   Monocytes Absolute 0.6 0.1 - 1.0 K/uL   Eosinophils Relative 0 %    Eosinophils Absolute 0.0 0.0 - 0.7 K/uL   Basophils Relative 0 %   Basophils Absolute 0.0 0.0 - 0.1 K/uL  Protime-INR     Status: Abnormal   Collection Time: 10/09/15 10:24 AM  Result Value Ref Range   Prothrombin Time 20.3 (H) 11.6 - 15.2 seconds   INR 1.73 (H) 0.00 - 1.49  Prepare fresh frozen plasma     Status: None (Preliminary result)   Collection Time: 10/09/15 12:29 PM  Result Value Ref Range   Unit Number [id number]    Blood Component Type THWPLS APHR1    Unit division 00    Status of Unit ALLOCATED    Transfusion Status OK TO TRANSFUSE   Type and screen [place]     Status: None   Collection Time: 10/09/15 12:37 PM  Result Value Ref Range   ABO/RH(D) A POS    Antibody Screen NEG    Sample Expiration 10/12/2015   ABO/Rh     Status: None (Preliminary result)   Collection Time: 10/09/15 12:37 PM  Result Value Ref Range   ABO/RH(D) A POS    _0 (sdes,specrequest,cult,reptstatus)   ) Recent Results (from the past 720 hour(s))  Blood Culture (routine x 2)     Status: None (Preliminary result)   Collection Time: 10/08/15 10:00 AM  Result Value Ref Range Status   Specimen Description BLOOD LEFT AC  Final   Special Requests BOTTLES DRAWN AEROBIC AND ANAEROBIC 5CC  Final   Culture  Setup Time   Final    GRAM POSITIVE COCCI IN CLUSTERS IN BOTH AEROBIC AND ANAEROBIC BOTTLES CRITICAL RESULT CALLED TO, READ BACK BY AND VERIFIED WITH: K BRUMAGIN 10/09/15 @ [place]   Final    GRAM POSITIVE COCCI Performed at [facility]    Report Status PENDING  Incomplete  Blood Culture (routine x 2)     Status: None (Preliminary result)   Collection Time: 10/08/15 10:20 AM  Result Value Ref Range Status   Specimen Description BLOOD LEFT HAND  Final   Special Requests BOTTLES DRAWN AEROBIC AND ANAEROBIC 5CC  Final   Culture PENDING  Incomplete   Report Status PENDING  Incomplete     Impression/Recommendation  Principal  Problem:   Liver abscess - by CT Active Problems:   Chills   Fatigue   Pulmonary infiltrates   Cough   Weight loss   RASHAN ROUNSAVILLE is a 50 y.o. male with liver lesion concerning for possible abscess versus neoplasm colonic pathology also could be due to neoplasm and multiple pulmonary nodules and finally one out of 2 positive blood cultures.  #1 Positive blood cultures: Given that the cultures are only showing an organism and wanted to actually could be a contaminant unless it is Staphylococcus aureus. The culture that is not showing any yield yet also his only been incubating for less than a day.  Agree with adding vancomycin  #2 Liver abscess vs malignancy:   I am worried that the most likley unifying diagnosis would be colon cancer with mets to liver and lung.  Hopefully instead this is an hepatic abscess  Really appreciate interventional radiology aspirating the lesion and sending for culture and for pathology.  Continue ceftriaxone and metronidazole and vancomycin for now  Follow-up cultures  #3 Lung lesions: If these are infectious I would suspect that they are sequela of embolic phenomenon from endocarditis or perhaps old nodules that are present from prior granulomatous infection from a dimorphic fungus. Again as above I can have concerns about the risk of underlying malignancy is metastasized to liver and lung.  #4 screening Will screen for HIV and hep C.    Dr. Megan Salon be covering this weekend I would back on Monday  10/09/2015, 2:06 PM   Thank you so much for this interesting consult  Greg Cook for [place] [phone] (pager) [phone] (office) 10/09/2015, 2:06 PM  Rhina Br[place] 10/09/2015, 2:06 PM

## 2015-10-09 NOTE — Sedation Documentation (Signed)
Still alert  

## 2015-10-09 NOTE — Sedation Documentation (Signed)
Blood/pus aspirated- drain placed successfully

## 2015-10-09 NOTE — Sedation Documentation (Signed)
Patient is resting comfortably. 

## 2015-10-09 NOTE — Progress Notes (Signed)
Initial Nutrition Assessment  DOCUMENTATION CODES:   Not applicable  INTERVENTION:   -RD will follow for diet advancement and supplement as appropriate  NUTRITION DIAGNOSIS:   Inadequate oral intake related to altered GI function as evidenced by NPO status.  GOAL:   Patient will meet greater than or equal to 90% of their needs  MONITOR:   Diet advancement, Labs, Weight trends, Skin, I & O's  REASON FOR ASSESSMENT:   Malnutrition Screening Tool    ASSESSMENT:   Greg Cook is a 50 y.o. male with no specific PMH, hasn't seen a doctor for years , presented with cough, weakness, loss of appetite and losing weight over the last 3 weeks. Patient provides the history. Patient was fine until around the time of the Super Bowl , about 2.5 weeks ago. He became ill with nausea and vomiting for a day, then had diarrhea for about 3-4 days. No bloody stool or fevers initially. The n/v/d resolved and he thought he would be getting better but he began to feel very tired during the day, a symptom which would come and go. This went on for the next week or so and has progressively gotten worse, the fatigue. He developed a cough less than 1 week ago and also has had several episodes of chills over the past 5-7 days. He denies purulent sputum production, abd pain, bloody stool, high fevers.   Pt admitted with nausea, vomiting, diarrhea, and progressive weakness.   Per MD notes, CT revealed large complex liver lesion in the rt lobe (suggesting possible abscess or metastasis).   Spoke with pt at bedside. He reveals poor po intake and weight loss over the past 2-3 weeks. He reports he was consuming a regular diet, but ate only a few bites at each meal due to early satiety. Prior to this, pt and pt mother reports pt had a hearty appetite and "ate whatever he wanted". He reports UBW around 184#. He is unable to quantify amount of weight he has lost, however, attributes to poor po intake over the  past several weeks.   Pt reports he is eager to eat, but has not eaten since admission, due to NPO status. He feels like he is ready to try solid foods. He declines offer of supplements with diet advancement and would like to meat his intake needs via food. Discussed importance of nutritional adequacy to support healing once diet is advanced.   Nutrition-Focused physical exam completed. Findings are no fat depletion, no muscle depletion, and no edema.   Labs reviewed: Na: 134 (on IV supplementation).   Diet Order:  Diet NPO time specified Except for: Ice Chips, Sips with Meds  Skin:  Reviewed, no issues  Last BM:  10/08/15  Height:   Ht Readings from Last 1 Encounters:  10/08/15 5\' 8"  (1.727 m)    Weight:   Wt Readings from Last 1 Encounters:  10/08/15 184 lb (83.462 kg)    Ideal Body Weight:  70 kg  BMI:  Body mass index is 27.98 kg/(m^2).  Estimated Nutritional Needs:   Kcal:  1900-2100  Protein:  100-110 grams  Fluid:  1.9-2.1 L  EDUCATION NEEDS:   No education needs identified at this time  Shalita Notte A. Jimmye Norman, RD, LDN, CDE Pager: [phone] After hours Pager: [phone]

## 2015-10-10 LAB — COMPREHENSIVE METABOLIC PANEL
ALK PHOS: 113 U/L (ref 38–126)
ALT: 24 U/L (ref 17–63)
ANION GAP: 8 (ref 5–15)
AST: 42 U/L — ABNORMAL HIGH (ref 15–41)
Albumin: 1.6 g/dL — ABNORMAL LOW (ref 3.5–5.0)
BUN: <5 mg/dL — ABNORMAL LOW (ref 6–20)
CALCIUM: 7.7 mg/dL — AB (ref 8.9–10.3)
CO2: 21 mmol/L — AB (ref 22–32)
Chloride: 104 mmol/L (ref 101–111)
Creatinine, Ser: 0.55 mg/dL — ABNORMAL LOW (ref 0.61–1.24)
GFR calc non Af Amer: >60 mL/min (ref >60)
Glucose, Bld: 122 mg/dL — ABNORMAL HIGH (ref 65–99)
Potassium: 3.3 mmol/L — ABNORMAL LOW (ref 3.5–5.1)
SODIUM: 133 mmol/L — AB (ref 135–145)
TOTAL PROTEIN: 5.5 g/dL — AB (ref 6.5–8.1)
Total Bilirubin: 1.6 mg/dL — ABNORMAL HIGH (ref 0.3–1.2)

## 2015-10-10 LAB — CBC
HCT: 33.1 % — ABNORMAL LOW (ref 39.0–52.0)
HEMOGLOBIN: 11.2 g/dL — AB (ref 13.0–17.0)
MCH: 33.1 pg (ref 26.0–34.0)
MCHC: 33.8 g/dL (ref 30.0–36.0)
MCV: 97.9 fL (ref 78.0–100.0)
Platelets: 108 10*3/uL — ABNORMAL LOW (ref 150–400)
RBC: 3.38 MIL/uL — ABNORMAL LOW (ref 4.22–5.81)
RDW: 14.9 % (ref 11.5–15.5)
WBC: 7.2 10*3/uL (ref 4.0–10.5)

## 2015-10-10 LAB — URINE CULTURE: CULTURE: NO GROWTH

## 2015-10-10 LAB — PREPARE FRESH FROZEN PLASMA: UNIT DIVISION: 0

## 2015-10-10 LAB — SEDIMENTATION RATE: SED RATE: 107 mm/h — AB (ref 0–16)

## 2015-10-10 LAB — PROTIME-INR
INR: 1.59 — AB (ref 0.00–1.49)
Prothrombin Time: 19 seconds — ABNORMAL HIGH (ref 11.6–15.2)

## 2015-10-10 LAB — C-REACTIVE PROTEIN: CRP: 19.5 mg/dL — ABNORMAL HIGH (ref <1.0)

## 2015-10-10 NOTE — Progress Notes (Signed)
Patient ID: Greg Cook, male   DOB: 01/21/1966, 50 y.o.   MRN: [id number]    Referring Physician(s): [facility]  Corrie Mckusick  Chief Complaint:  Hepatic abscess  Subjective:  Pt feeling better today; denies sig abd pain, N/V, CP or dyspnea  Allergies: Review of patient's allergies indicates no known allergies.  Medications: Prior to Admission medications   Medication Sig Start Date End Date Taking? Authorizing Provider  Aspirin-Acetaminophen-Caffeine (GOODYS EXTRA STRENGTH) [phone] MG PACK Take 1 packet by mouth 2 (two) times daily as needed. For pain   Yes Historical Provider, MD  ibuprofen (ADVIL,MOTRIN) 200 MG tablet Take 400 mg by mouth every 6 (six) hours as needed.   Yes Historical Provider, MD     Vital Signs: BP 131/97 mmHg  Pulse 116  Temp(Src) 99.4 F (37.4 C) (Oral)  Resp 17  Ht 5\' 8"  (1.727 m)  Wt 184 lb (83.462 kg)  BMI 27.98 kg/m2  SpO2 91%  Physical Exam rt hepatic drain intact, site NT; output 25-30 cc turbid, beige fluid; cx's pend  Imaging: Dg Chest 2 View  10/08/2015  CLINICAL DATA:  Cough and congestion since 09/16/2015, vomiting and diarrhea 3 weeks ago but none since, fatigue, tired beginning, ongoing cough, decreased appetite, smoker EXAM: CHEST  2 VIEW COMPARISON:  03/05/2006 FINDINGS: Normal heart size, mediastinal contours and pulmonary vascularity. Central peribronchial thickening. Opacity in LEFT upper lobe question mass versus infiltrate. Linear subsegmental atelectasis RIGHT base. Remaining lungs clear. No pleural effusion or pneumothorax. Bones unremarkable. IMPRESSION: LEFT upper lobe opacity question mass versus focal infiltrate, malignancy not excluded; CT chest with contrast recommended to exclude tumor. Electronically Signed   By: Lavonia Dana M.D.   On: 10/08/2015 09:51   Ct Chest W Contrast  10/08/2015  CLINICAL DATA:  50 year old with cough and congestion. Fatigue and decreased appetite. Opacity in left upper lobe on recent chest  radiograph. EXAM: CT CHEST, ABDOMEN, AND PELVIS WITH CONTRAST TECHNIQUE: Multidetector CT imaging of the chest, abdomen and pelvis was performed following the standard protocol during bolus administration of intravenous contrast. CONTRAST:  25mL OMNIPAQUE IOHEXOL 300 MG/ML SOLN, 176mL OMNIPAQUE IOHEXOL 300 MG/ML SOLN COMPARISON:  Chest radiograph 10/08/2015 FINDINGS: CT CHEST FINDINGS Mediastinum/Lymph Nodes: Left anterior descending coronary artery is heavily calcified. There is no significant pericardial fluid. Small lymph nodes throughout the mediastinum. Overall, no significant chest lymphadenopathy. No axillary lymphadenopathy. Normal caliber of the thoracic aorta. Lungs/Pleura: The trachea and mainstem bronchi are patent. There is a focal irregular opacity at the lingula measured 2.7 cm on sequence 4, image 47. Few subtle peripheral densities in left upper lobe. Focal parenchymal densities along the bronchovascular distribution in the left upper lobe. This lesion accounts for the recent chest radiograph finding. There is also a subtle pleural-based opacity in the superior segment of left lower lobe on sequence 4, image 28. Musculoskeletal: No acute bone abnormality. No suspicious bone findings. CT ABDOMEN PELVIS FINDINGS Hepatobiliary: There are multiple low-density structures along the posterior medial aspect of the right hepatic lobe. In addition, there is low-density in the surrounding right hepatic lobe. Difficult to measure this process but it roughly measures 5.9 x 6.6 x 8.1 cm. Normal appearance the left hepatic lobe. Main portal venous system is patent. Normal appearance of the gallbladder. Pancreas: Normal appearance of pancreas without inflammation or duct dilatation. Spleen: Normal appearance of spleen without enlargement. Adrenals/Urinary Tract: Normal adrenal glands. 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 There is mild-moderate dilatation of the proximal right ureters. There  is mild  dilatation of the distal right ureter. No evidence for an obstructing stone. Small fluid in the urinary bladder without gross abnormality. No acute inflammatory changes involving the kidneys. Stomach/Bowel: There is oral contrast in the small bowel and colon. There is extensive diverticulosis involving the left colon. Asymmetric wall thickening involving the proximal sigmoid colon on sequence 2, image 97. In addition, there is focal wall thickening in the left colon at the junction of the sigmoid colon and descending colon on sequence 2, image 89 that measures up to 2.0 cm. No significant pericolonic inflammation. No acute abnormality to the stomach or small bowel. Vascular/Lymphatic: Few atherosclerotic calcifications of the abdominal aorta without aneurysm. Plaque involving the external iliac arteries bilaterally. There is no significant abdominal or pelvic lymphadenopathy. Small retroperitoneal lymph nodes along the left side of the aorta. Reproductive: Calcifications in the prostate without significant enlargement. No free fluid. No free air. Small periumbilical hernia containing fat. Other: Intramedullary nail with screw fixation in the proximal right femur. No suspicious bone findings. Musculoskeletal:  No suspicious bone lesions identified. IMPRESSION: There are scattered opacities in the left lung, largest in the left upper lobe. Based on the distribution of disease, favor an infectious etiology. However, the rounded opacity in the lingula also raises concern for a neoplastic process. Recommend follow-up after antibiotic therapy to ensure resolution of these lung findings. Large complex lesion involving the right hepatic lobe. This lesion is multiloculated with low-density or cystic areas. Findings are concerning for a complex hepatic abscess. A neoplastic process cannot be excluded. The extent of this liver lesion could be better characterized with MRI, with and without contrast. Areas of asymmetric wall  thickening involving the left colon at the junction of the descending colon and sigmoid colon. Findings could represent areas of diverticulitis but there is no significant pericolonic inflammation. Colonic neoplasms are also in the differential diagnosis. 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 There is also mild dilatation to the distal right ureter. The right hydronephrosis could be chronic because there is no significant inflammatory changes in this area. However, a distal right ureter lesion cannot be excluded. These results were called by telephone at the time of interpretation on 10/08/2015 at 12:04 pm to Dr. Darl Householder , who verbally acknowledged these results. Electronically Signed   By: Markus Daft M.D.   On: 10/08/2015 12:13   Ct Abdomen Pelvis W Contrast  10/08/2015  CLINICAL DATA:  50 year old with cough and congestion. Fatigue and decreased appetite. Opacity in left upper lobe on recent chest radiograph. EXAM: CT CHEST, ABDOMEN, AND PELVIS WITH CONTRAST TECHNIQUE: Multidetector CT imaging of the chest, abdomen and pelvis was performed following the standard protocol during bolus administration of intravenous contrast. CONTRAST:  67mL OMNIPAQUE IOHEXOL 300 MG/ML SOLN, 13mL OMNIPAQUE IOHEXOL 300 MG/ML SOLN COMPARISON:  Chest radiograph 10/08/2015 FINDINGS: CT CHEST FINDINGS Mediastinum/Lymph Nodes: Left anterior descending coronary artery is heavily calcified. There is no significant pericardial fluid. Small lymph nodes throughout the mediastinum. Overall, no significant chest lymphadenopathy. No axillary lymphadenopathy. Normal caliber of the thoracic aorta. Lungs/Pleura: The trachea and mainstem bronchi are patent. There is a focal irregular opacity at the lingula measured 2.7 cm on sequence 4, image 47. Few subtle peripheral densities in left upper lobe. Focal parenchymal densities along the bronchovascular distribution in the left upper lobe. This lesion accounts for the  recent chest radiograph finding. There is also a subtle pleural-based opacity in the superior segment of left lower lobe on  sequence 4, image 28. Musculoskeletal: No acute bone abnormality. No suspicious bone findings. CT ABDOMEN PELVIS FINDINGS Hepatobiliary: There are multiple low-density structures along the posterior medial aspect of the right hepatic lobe. In addition, there is low-density in the surrounding right hepatic lobe. Difficult to measure this process but it roughly measures 5.9 x 6.6 x 8.1 cm. Normal appearance the left hepatic lobe. Main portal venous system is patent. Normal appearance of the gallbladder. Pancreas: Normal appearance of pancreas without inflammation or duct dilatation. Spleen: Normal appearance of spleen without enlargement. Adrenals/Urinary Tract: Normal adrenal glands. 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 There is mild-moderate dilatation of the proximal right ureters. There is mild dilatation of the distal right ureter. No evidence for an obstructing stone. Small fluid in the urinary bladder without gross abnormality. No acute inflammatory changes involving the kidneys. Stomach/Bowel: There is oral contrast in the small bowel and colon. There is extensive diverticulosis involving the left colon. Asymmetric wall thickening involving the proximal sigmoid colon on sequence 2, image 97. In addition, there is focal wall thickening in the left colon at the junction of the sigmoid colon and descending colon on sequence 2, image 89 that measures up to 2.0 cm. No significant pericolonic inflammation. No acute abnormality to the stomach or small bowel. Vascular/Lymphatic: Few atherosclerotic calcifications of the abdominal aorta without aneurysm. Plaque involving the external iliac arteries bilaterally. There is no significant abdominal or pelvic lymphadenopathy. Small retroperitoneal lymph nodes along the left side of the aorta. Reproductive: Calcifications in the prostate  without significant enlargement. No free fluid. No free air. Small periumbilical hernia containing fat. Other: Intramedullary nail with screw fixation in the proximal right femur. No suspicious bone findings. Musculoskeletal:  No suspicious bone lesions identified. IMPRESSION: There are scattered opacities in the left lung, largest in the left upper lobe. Based on the distribution of disease, favor an infectious etiology. However, the rounded opacity in the lingula also raises concern for a neoplastic process. Recommend follow-up after antibiotic therapy to ensure resolution of these lung findings. Large complex lesion involving the right hepatic lobe. This lesion is multiloculated with low-density or cystic areas. Findings are concerning for a complex hepatic abscess. A neoplastic process cannot be excluded. The extent of this liver lesion could be better characterized with MRI, with and without contrast. Areas of asymmetric wall thickening involving the left colon at the junction of the descending colon and sigmoid colon. Findings could represent areas of diverticulitis but there is no significant pericolonic inflammation. Colonic neoplasms are also in the differential diagnosis. 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 There is also mild dilatation to the distal right ureter. The right hydronephrosis could be chronic because there is no significant inflammatory changes in this area. However, a distal right ureter lesion cannot be excluded. These results were called by telephone at the time of interpretation on 10/08/2015 at 12:04 pm to Dr. Darl Householder , who verbally acknowledged these results. Electronically Signed   By: Markus Daft M.D.   On: 10/08/2015 12:13    Labs:  CBC:  Recent Labs  10/08/15 0929 10/09/15 0515 10/10/15 0618  WBC 8.1 9.2 7.2  HGB 13.6 12.7* 11.2*  HCT 38.1* 37.2* 33.1*  PLT 102* 92* 108*    COAGS:  Recent Labs  10/09/15 1024 10/10/15 0618  INR  1.73* 1.59*    BMP:  Recent Labs  10/08/15 0929 10/09/15 0515 10/10/15 0618  NA 128* 134* 133*  K 2.7* 3.8  3.3*  CL 92* 104 104  CO2 24 19* 21*  GLUCOSE 161* 99 122*  BUN 12 5* <5*  CALCIUM 7.7* 7.7* 7.7*  CREATININE 0.75 0.66 0.55*  GFRNONAA >60 >60 >60  GFRAA >60 >60 >60    LIVER FUNCTION TESTS:  Recent Labs  10/08/15 0929 10/09/15 0515 10/10/15 0618  BILITOT 2.2* 2.4* 1.6*  AST 55* 53* 42*  ALT 33 30 24  ALKPHOS 140* 137* 113  PROT 7.0 6.4* 5.5*  ALBUMIN 2.2* 1.8* 1.6*    Assessment and Plan: S/p right hepatic abscess drain 2/24; temp 99.4; WBC nl; hgb 11.2; creat .55; t bili 1.6 (2.4); other LFT's down; check final cx's; cont drain irrigation; monitor labs; check f/u CT next week; send drain fluid for cytology   Electronically Signed: D. Rowe Robert 10/10/2015, 1:36 PM   I spent a total of 15 minutes at the the patient's bedside AND on the patient's hospital floor or unit, greater than 50% of which was counseling/coordinating care for hepatic abscess drain

## 2015-10-10 NOTE — Progress Notes (Signed)
15mL drained from JP drain. Appearance is a milky white substance.

## 2015-10-10 NOTE — Progress Notes (Addendum)
PROGRESS NOTE  Greg Cook [id number] DOB: September 03, 1965 DOA: 10/08/2015 PCP: No primary care provider on file. Outpatient Specialists:    LOS: 2 days   Brief Narrative: Greg Cook is a 50 year old male who was admitted yesterday for sepsis-like picture, fatigue, cough and decreased appetite for the past 2-1/2 weeks. CT revealed scattered opacities of the left lung, a hepatic lesion and asymmetric thickening of the left colon wall.  Assessment & Plan: Principal Problem:   Liver abscess - by CT Active Problems:   Chills   Fatigue   Pulmonary infiltrates   Cough   Weight loss   Colonic mass   Hepatic abscess   Lung nodules   Smoker   Screen for STD (sexually transmitted disease)   Tobacco abuse    Sepsis due to liver abscess - Patient febrile, tachycardic and tachypneic on admission with CT scan concerning for liver abscess - Interventional Radiology to aspirate abscess today - will send for labs. Discussed with IR. - Patient reports no abdominal pain or tenderness. - Currently being treated with ceftriaxone, metronidazole and vancomycin for suspected abscess. - ESR and CRP pending - Blood cultures positive for gram-positive, 1 out of 2 bottles, coag negative staph, likely contaminant. Antibiotics per ID  Colonic wall thickening - Neoplastic process vs diverticulitis - discussed with Dr. Enis Gash from GI, will need a colonoscopy, may be done as outpatient but soon.  Pulmonary infiltrates with cough - Scattered opacities of the left lung concerning for infection, but the opacity in the lingula raised concern for neoplasm. - Denies sputum production but endorses a "dry" cough for the past week. - Antibiotics should cover PNA   Elevated LFTs - related to the liver lesion  Thrombocytopenia - unclear etiology currently, may be related to sepsis vs alcohol abuse.  - no mentions of liver disease on imaging  Oral thrush - Noted on the buccal mucosa and oropharynx. -  Patient reports no pain or discomfort - Start nystatin swish and swallow, monitor response  Alcohol abuse - Patient's mother stated that patient drinks heavily in the weekends - Patient reports drinking 6-12 beers on weekend nights and 1-2 some mornings when he gets off of work. - Unclear as to the accuracy of his drinking claims  - monitor for withdrawal, start CIWA  Elevated INR - FFP per IR during procedure - Awaiting labs for reevaluation.  Tobacco abuse   DVT prophylaxis: Lovenox   Code Status: Full Family Communication: None at bedside Disposition Plan: Home when ready Barriers for discharge: sepsis  Consultants:   ID  IR  Procedures: None  Antimicrobials: Ceftriaxone  2/23 >> Metronidazole  2/23 >> Vancomycin  2/23 >>  Subjective: - no complaints, denies abdominal pain   Objective: Filed Vitals:   10/10/15 0000 10/10/15 0308 10/10/15 0606 10/10/15 0606  BP:   131/97   Pulse:   116 116  Temp: 100.6 F (38.1 C) 100.4 F (38 C) 99.4 F (37.4 C)   TempSrc: Oral Oral Oral   Resp:   17   Height:      Weight:      SpO2:   89% 91%    Intake/Output Summary (Last 24 hours) at 10/10/15 1451 Last data filed at 10/10/15 1328  Gross per 24 hour  Intake   2728 ml  Output   1975 ml  Net    753 ml   Filed Weights   10/08/15 1022  Weight: 83.462 kg (184 lb)    Examination: BP  131/97 mmHg  Pulse 116  Temp(Src) 99.4 F (37.4 C) (Oral)  Resp 17  Ht 5' 8"  (1.727 m)  Wt 83.462 kg (184 lb)  BMI 27.98 kg/m2  SpO2 91% General exam: NAD Respiratory system: Clear. No increased work of breathing. No wheezing, no crackles Cardiovascular system: regular rate and rhythm, no murmurs, gallops. No JVD. No peripheral edema.  Gastrointestinal system: Abdomen is nondistended, soft and nontender. Normal bowel sounds heard. Central nervous system: AxOx3. No focal deficits Extremities: No clubbing/cyanosis Skin: no rashes  Data Reviewed: I have personally reviewed  following labs and imaging studies  CBC:  Recent Labs Lab 10/08/15 0929 10/09/15 0515 10/10/15 0618  WBC 8.1 9.2 7.2  NEUTROABS 5.8 8.1*  --   HGB 13.6 12.7* 11.2*  HCT 38.1* 37.2* 33.1*  MCV 97.4 98.4 97.9  PLT 102* 92* 662*   Basic Metabolic Panel:  Recent Labs Lab 10/08/15 0929 10/09/15 0515 10/10/15 0618  NA 128* 134* 133*  K 2.7* 3.8 3.3*  CL 92* 104 104  CO2 24 19* 21*  GLUCOSE 161* 99 122*  BUN 12 5* <5*  CREATININE 0.75 0.66 0.55*  CALCIUM 7.7* 7.7* 7.7*   GFR: Estimated Creatinine Clearance: 118.8 mL/min (by C-G formula based on Cr of 0.55). Liver Function Tests:  Recent Labs Lab 10/08/15 0929 10/09/15 0515 10/10/15 0618  AST 55* 53* 42*  ALT 33 30 24  ALKPHOS 140* 137* 113  BILITOT 2.2* 2.4* 1.6*  PROT 7.0 6.4* 5.5*  ALBUMIN 2.2* 1.8* 1.6*   Coagulation Profile:  Recent Labs Lab 10/09/15 1024 10/10/15 0618  INR 1.73* 1.59*   CBG:  Recent Labs Lab 10/08/15 0933  GLUCAP 130*   Urine analysis:    Component Value Date/Time   COLORURINE YELLOW 10/08/2015 1730   APPEARANCEUR CLEAR 10/08/2015 1730   LABSPEC 1.015 10/08/2015 1730   PHURINE 7.0 10/08/2015 1730   GLUCOSEU NEGATIVE 10/08/2015 1730   HGBUR NEGATIVE 10/08/2015 1730   BILIRUBINUR NEGATIVE 10/08/2015 1730   KETONESUR NEGATIVE 10/08/2015 1730   PROTEINUR NEGATIVE 10/08/2015 1730   NITRITE NEGATIVE 10/08/2015 1730   LEUKOCYTESUR NEGATIVE 10/08/2015 1730   Sepsis Labs: Invalid input(s): PROCALCITONIN, LACTICIDVEN  Recent Results (from the past 240 hour(s))  Blood Culture (routine x 2)     Status: None (Preliminary result)   Collection Time: 10/08/15 10:00 AM  Result Value Ref Range Status   Specimen Description BLOOD LEFT AC  Final   Special Requests BOTTLES DRAWN AEROBIC AND ANAEROBIC 5CC  Final   Culture  Setup Time   Final    GRAM POSITIVE COCCI IN CLUSTERS IN BOTH AEROBIC AND ANAEROBIC BOTTLES CRITICAL RESULT CALLED TO, READ BACK BY AND VERIFIED WITH: K BRUMAGIN  10/09/15 @ 84 M VESTAL    Culture   Final    STAPHYLOCOCCUS SPECIES (COAGULASE NEGATIVE) THE SIGNIFICANCE OF ISOLATING THIS ORGANISM FROM A SINGLE SET OF BLOOD CULTURES WHEN MULTIPLE SETS ARE DRAWN IS UNCERTAIN. PLEASE NOTIFY THE MICROBIOLOGY DEPARTMENT WITHIN ONE WEEK IF SPECIATION AND SENSITIVITIES ARE REQUIRED. Performed at [facility]    Report Status PENDING  Incomplete  Blood Culture (routine x 2)     Status: None (Preliminary result)   Collection Time: 10/08/15 10:20 AM  Result Value Ref Range Status   Specimen Description BLOOD LEFT HAND  Final   Special Requests BOTTLES DRAWN AEROBIC AND ANAEROBIC 5CC  Final   Culture   Final    NO GROWTH 2 DAYS Performed at [facility]    Report  Status PENDING  Incomplete  Urine culture     Status: None   Collection Time: 10/08/15  5:30 PM  Result Value Ref Range Status   Specimen Description URINE, CLEAN CATCH  Final   Special Requests NONE  Final   Culture   Final    NO GROWTH 2 DAYS Performed at [facility]    Report Status 10/10/2015 FINAL  Final    Radiology Studies: No results found.   Scheduled Meds: . sodium chloride   Intravenous Once  . cefTRIAXone (ROCEPHIN)  IV  2 g Intravenous Q24H  . enoxaparin (LOVENOX) injection  40 mg Subcutaneous Q24H  . feeding supplement (ENSURE ENLIVE)  237 mL Oral BID BM  . folic acid  1 mg Oral Daily  . metronidazole  500 mg Intravenous 3 times per day  . multivitamin with minerals  1 tablet Oral Daily  . nystatin  5 mL Oral QID  . thiamine  100 mg Oral Daily   Or  . thiamine  100 mg Intravenous Daily  . vancomycin  1,000 mg Intravenous Q8H   Continuous Infusions: . sodium chloride 1,000 mL (10/10/15 0646)     Marzetta Board, MD, PhD [facility] Pager [phone]  If 7PM-7AM, please contact night-coverage [email or web address] Password [facility] 10/10/2015, 2:51 PM

## 2015-10-10 NOTE — Progress Notes (Signed)
Patient ID: Greg Cook, male   DOB: 16-Dec-1965, 50 y.o.   MRN: [id number]         [facility]    Date of Admission:  10/08/2015           Day 3 vancomycin        Day 3 ceftriaxone        Day 3 metronidazole  Principal Problem:   Liver abscess - by CT Active Problems:   Chills   Fatigue   Pulmonary infiltrates   Cough   Weight loss   Colonic mass   Hepatic abscess   Lung nodules   Smoker   Screen for STD (sexually transmitted disease)   Tobacco abuse   . sodium chloride   Intravenous Once  . cefTRIAXone (ROCEPHIN)  IV  2 g Intravenous Q24H  . enoxaparin (LOVENOX) injection  40 mg Subcutaneous Q24H  . feeding supplement (ENSURE ENLIVE)  237 mL Oral BID BM  . folic acid  1 mg Oral Daily  . metronidazole  500 mg Intravenous 3 times per day  . multivitamin with minerals  1 tablet Oral Daily  . nystatin  5 mL Oral QID  . thiamine  100 mg Oral Daily   Or  . thiamine  100 mg Intravenous Daily  . vancomycin  1,000 mg Intravenous Q8H    SUBJECTIVE: He is feeling much better today. He has much more energy and his appetite is returning. He is no longer having any diarrhea. He has not had any abdominal pain, cough or shortness of breath.  Review of Systems: Review of Systems  Constitutional: Positive for weight loss. Negative for fever, chills, malaise/fatigue and diaphoresis.       6 pound unintentional weight loss since his illness began 3 weeks ago.  HENT: Negative for sore throat.   Respiratory: Negative for cough, sputum production and shortness of breath.   Cardiovascular: Negative for chest pain.  Gastrointestinal: Negative for nausea, vomiting and diarrhea.  Genitourinary: Negative for dysuria.  Musculoskeletal: Negative for myalgias and joint pain.  Skin: Negative for rash.  Neurological: Negative for dizziness and headaches.    History reviewed. No pertinent past medical history.  Social History  Substance Use Topics  . Smoking  status: Current Every Day Smoker  . Smokeless tobacco: None  . Alcohol Use: None    History reviewed. No pertinent family history. No Known Allergies  OBJECTIVE: Filed Vitals:   10/10/15 0000 10/10/15 0308 10/10/15 0606 10/10/15 0606  BP:   131/97   Pulse:   116 116  Temp: 100.6 F (38.1 C) 100.4 F (38 C) 99.4 F (37.4 C)   TempSrc: Oral Oral Oral   Resp:   17   Height:      Weight:      SpO2:   89% 91%   Body mass index is 27.98 kg/(m^2).  Physical Exam  Constitutional: He is oriented to person, place, and time.  He is alert and comfortable. He is eating lunch.  HENT:  Mouth/Throat: No oropharyngeal exudate.  Eyes: Conjunctivae are normal.  Cardiovascular: Normal rate and regular rhythm.   No murmur heard. Pulmonary/Chest: Breath sounds normal.  Abdominal: Soft. He exhibits distension. He exhibits no mass. There is no tenderness.  Right upper quadrant drain with a few cc of cloudy fluid in drain bulb. Healed midline surgical incision.  Musculoskeletal: Normal range of motion.  Neurological: He is alert and oriented to person, place, and time.  Skin: No  rash noted.  Psychiatric: Mood and affect normal.    Lab Results Lab Results  Component Value Date   WBC 7.2 10/10/2015   HGB 11.2* 10/10/2015   HCT 33.1* 10/10/2015   MCV 97.9 10/10/2015   PLT 108* 10/10/2015    Lab Results  Component Value Date   CREATININE 0.55* 10/10/2015   BUN <5* 10/10/2015   NA 133* 10/10/2015   K 3.3* 10/10/2015   CL 104 10/10/2015   CO2 21* 10/10/2015    Lab Results  Component Value Date   ALT 24 10/10/2015   AST 42* 10/10/2015   ALKPHOS 113 10/10/2015   BILITOT 1.6* 10/10/2015     Microbiology: Recent Results (from the past 240 hour(s))  Blood Culture (routine x 2)     Status: None (Preliminary result)   Collection Time: 10/08/15 10:00 AM  Result Value Ref Range Status   Specimen Description BLOOD LEFT AC  Final   Special Requests BOTTLES DRAWN AEROBIC AND ANAEROBIC  5CC  Final   Culture  Setup Time   Final    GRAM POSITIVE COCCI IN CLUSTERS IN BOTH AEROBIC AND ANAEROBIC BOTTLES CRITICAL RESULT CALLED TO, READ BACK BY AND VERIFIED WITH: K BRUMAGIN 10/09/15 @ 26 M VESTAL    Culture   Final    STAPHYLOCOCCUS SPECIES (COAGULASE NEGATIVE) THE SIGNIFICANCE OF ISOLATING THIS ORGANISM FROM A SINGLE SET OF BLOOD CULTURES WHEN MULTIPLE SETS ARE DRAWN IS UNCERTAIN. PLEASE NOTIFY THE MICROBIOLOGY DEPARTMENT WITHIN ONE WEEK IF SPECIATION AND SENSITIVITIES ARE REQUIRED. Performed at [facility]    Report Status PENDING  Incomplete  Blood Culture (routine x 2)     Status: None (Preliminary result)   Collection Time: 10/08/15 10:20 AM  Result Value Ref Range Status   Specimen Description BLOOD LEFT HAND  Final   Special Requests BOTTLES DRAWN AEROBIC AND ANAEROBIC 5CC  Final   Culture   Final    NO GROWTH 2 DAYS Performed at [facility]    Report Status PENDING  Incomplete  Urine culture     Status: None   Collection Time: 10/08/15  5:30 PM  Result Value Ref Range Status   Specimen Description URINE, CLEAN CATCH  Final   Special Requests NONE  Final   Culture   Final    NO GROWTH 2 DAYS Performed at [facility]    Report Status 10/10/2015 FINAL  Final     ASSESSMENT: He is improving on broad empiric therapy for liver abscess. He has some colonic thickening on CT which will probably need further evaluation. He also has some left midlung infiltrates which may represent asymptomatic pneumonia.  PLAN: 1. Continue current antibiotics pending final cultures  Michel Bickers, MD [facility] for [place] [phone] pager   [phone] cell 10/10/2015, 3:02 PM

## 2015-10-11 DIAGNOSIS — B957 Other staphylococcus as the cause of diseases classified elsewhere: Secondary | ICD-10-CM

## 2015-10-11 LAB — BASIC METABOLIC PANEL
ANION GAP: 8 (ref 5–15)
BUN: <5 mg/dL — ABNORMAL LOW (ref 6–20)
CHLORIDE: 104 mmol/L (ref 101–111)
CO2: 21 mmol/L — AB (ref 22–32)
Calcium: 7.7 mg/dL — ABNORMAL LOW (ref 8.9–10.3)
Creatinine, Ser: 0.53 mg/dL — ABNORMAL LOW (ref 0.61–1.24)
GFR calc Af Amer: >60 mL/min (ref >60)
GFR calc non Af Amer: >60 mL/min (ref >60)
GLUCOSE: 99 mg/dL (ref 65–99)
Potassium: 3.3 mmol/L — ABNORMAL LOW (ref 3.5–5.1)
Sodium: 133 mmol/L — ABNORMAL LOW (ref 135–145)

## 2015-10-11 LAB — CBC
HCT: 34.3 % — ABNORMAL LOW (ref 39.0–52.0)
HEMOGLOBIN: 11.8 g/dL — AB (ref 13.0–17.0)
MCH: 33.8 pg (ref 26.0–34.0)
MCHC: 34.4 g/dL (ref 30.0–36.0)
MCV: 98.3 fL (ref 78.0–100.0)
Platelets: 113 10*3/uL — ABNORMAL LOW (ref 150–400)
RBC: 3.49 MIL/uL — AB (ref 4.22–5.81)
RDW: 14.8 % (ref 11.5–15.5)
WBC: 5.5 10*3/uL (ref 4.0–10.5)

## 2015-10-11 LAB — CEA: CEA: 5.5 ng/mL — AB (ref 0.0–4.7)

## 2015-10-11 LAB — CULTURE, BLOOD (ROUTINE X 2)

## 2015-10-11 LAB — AFP TUMOR MARKER: AFP-Tumor Marker: 1.7 ng/mL (ref 0.0–8.3)

## 2015-10-11 LAB — HCV COMMENT:

## 2015-10-11 LAB — HEPATITIS C ANTIBODY (REFLEX): HCV Ab: <0.1 s/co ratio (ref 0.0–0.9)

## 2015-10-11 MED ORDER — POTASSIUM CHLORIDE CRYS ER 20 MEQ PO TBCR
40.0000 meq | EXTENDED_RELEASE_TABLET | Freq: Once | ORAL | Status: AC
  Administered 2015-10-11: 40 meq via ORAL
  Filled 2015-10-11: qty 2

## 2015-10-11 MED ORDER — SODIUM CHLORIDE 0.9% FLUSH
10.0000 mL | INTRAVENOUS | Status: DC | PRN
  Administered 2015-10-12: 30 mL
  Administered 2015-10-12 – 2015-10-14 (×3): 10 mL
  Filled 2015-10-11 (×4): qty 40

## 2015-10-11 NOTE — Progress Notes (Signed)
Patient ID: Greg Cook, male   DOB: Aug 21, 1965, 50 y.o.   MRN: [id number]         [facility]    Date of Admission:  10/08/2015           Day 4 vancomycin        Day 4 ceftriaxone        Day 4 metronidazole  Principal Problem:   Liver abscess - by CT Active Problems:   Chills   Fatigue   Pulmonary infiltrates   Cough   Weight loss   Colonic mass   Hepatic abscess   Lung nodules   Smoker   Screen for STD (sexually transmitted disease)   Tobacco abuse   . sodium chloride   Intravenous Once  . cefTRIAXone (ROCEPHIN)  IV  2 g Intravenous Q24H  . enoxaparin (LOVENOX) injection  40 mg Subcutaneous Q24H  . feeding supplement (ENSURE ENLIVE)  237 mL Oral BID BM  . folic acid  1 mg Oral Daily  . metronidazole  500 mg Intravenous 3 times per day  . multivitamin with minerals  1 tablet Oral Daily  . nystatin  5 mL Oral QID  . thiamine  100 mg Oral Daily   Or  . thiamine  100 mg Intravenous Daily  . vancomycin  1,000 mg Intravenous Q8H    SUBJECTIVE: He is feeling much better today. However he has noted some increased loose but nonproductive cough today. He does not have any shortness of breath.  Review of Systems: Review of Systems  Constitutional: Positive for weight loss. Negative for fever, chills, malaise/fatigue and diaphoresis.       6 pound unintentional weight loss since his illness began 3 weeks ago.  HENT: Negative for sore throat.   Respiratory: Negative for cough, sputum production and shortness of breath.   Cardiovascular: Negative for chest pain.  Gastrointestinal: Negative for nausea, vomiting and diarrhea.  Genitourinary: Negative for dysuria.  Musculoskeletal: Negative for myalgias and joint pain.  Skin: Negative for rash.  Neurological: Negative for dizziness and headaches.    History reviewed. No pertinent past medical history.  Social History  Substance Use Topics  . Smoking status: Current Every Day Smoker  .  Smokeless tobacco: None  . Alcohol Use: None    History reviewed. No pertinent family history. No Known Allergies  OBJECTIVE: Filed Vitals:   10/10/15 1616 10/10/15 2102 10/11/15 0519 10/11/15 1359  BP: 143/80 138/90 129/90 134/96  Pulse: 110 109 115 108  Temp: 99.2 F (37.3 C) 99.5 F (37.5 C) 98.7 F (37.1 C) 98.5 F (36.9 C)  TempSrc: Oral Oral Oral Oral  Resp: 21 19 18 18   Height:      Weight:      SpO2: 91% 94% 95% 94%   Body mass index is 27.98 kg/(m^2).  Physical Exam  Constitutional: He is oriented to person, place, and time.  He is alert and comfortable. He is eating lunch.  HENT:  Mouth/Throat: No oropharyngeal exudate.  Eyes: Conjunctivae are normal.  Cardiovascular: Normal rate and regular rhythm.   No murmur heard. Pulmonary/Chest: He has no rales.  Few crackles in right midlung.  Abdominal: Soft. He exhibits distension. He exhibits no mass. There is no tenderness.  There is a small amount of clear yellow fluid in drain bulb. 10 mL of output has been recorded today.  Musculoskeletal: Normal range of motion.  Neurological: He is alert and oriented to person, place, and time.  Skin: No rash noted.  Psychiatric: Mood and affect normal.    Lab Results Lab Results  Component Value Date   WBC 5.5 10/11/2015   HGB 11.8* 10/11/2015   HCT 34.3* 10/11/2015   MCV 98.3 10/11/2015   PLT 113* 10/11/2015    Lab Results  Component Value Date   CREATININE 0.53* 10/11/2015   BUN <5* 10/11/2015   NA 133* 10/11/2015   K 3.3* 10/11/2015   CL 104 10/11/2015   CO2 21* 10/11/2015    Lab Results  Component Value Date   ALT 24 10/10/2015   AST 42* 10/10/2015   ALKPHOS 113 10/10/2015   BILITOT 1.6* 10/10/2015     Microbiology: Recent Results (from the past 240 hour(s))  Blood Culture (routine x 2)     Status: None   Collection Time: 10/08/15 10:00 AM  Result Value Ref Range Status   Specimen Description BLOOD LEFT AC  Final   Special Requests BOTTLES DRAWN  AEROBIC AND ANAEROBIC 5CC  Final   Culture  Setup Time   Final    GRAM POSITIVE COCCI IN CLUSTERS IN BOTH AEROBIC AND ANAEROBIC BOTTLES CRITICAL RESULT CALLED TO, READ BACK BY AND VERIFIED WITH: K BRUMAGIN 10/09/15 @ 65 M VESTAL    Culture   Final    STAPHYLOCOCCUS SPECIES (COAGULASE NEGATIVE) THE SIGNIFICANCE OF ISOLATING THIS ORGANISM FROM A SINGLE SET OF BLOOD CULTURES WHEN MULTIPLE SETS ARE DRAWN IS UNCERTAIN. PLEASE NOTIFY THE MICROBIOLOGY DEPARTMENT WITHIN ONE WEEK IF SPECIATION AND SENSITIVITIES ARE REQUIRED. Performed at [facility]    Report Status 10/11/2015 FINAL  Final  Blood Culture (routine x 2)     Status: None (Preliminary result)   Collection Time: 10/08/15 10:20 AM  Result Value Ref Range Status   Specimen Description BLOOD LEFT HAND  Final   Special Requests BOTTLES DRAWN AEROBIC AND ANAEROBIC 5CC  Final   Culture   Final    NO GROWTH 3 DAYS Performed at [facility]    Report Status PENDING  Incomplete  Urine culture     Status: None   Collection Time: 10/08/15  5:30 PM  Result Value Ref Range Status   Specimen Description URINE, CLEAN CATCH  Final   Special Requests NONE  Final   Culture   Final    NO GROWTH 2 DAYS Performed at [facility]    Report Status 10/10/2015 FINAL  Final  Culture, routine-abscess     Status: None (Preliminary result)   Collection Time: 10/09/15  4:58 PM  Result Value Ref Range Status   Specimen Description ABSCESS  Final   Special Requests LIVER  Final   Gram Stain PENDING  Incomplete   Culture   Final    Culture reincubated for better growth Performed at [facility]    Report Status PENDING  Incomplete     ASSESSMENT: He is showing some improvement on broad empiric antibiotic therapy for liver abscess and pneumonia. One blood culture grew coagulase-negative staph that is probably contaminant. Abscess cultures are negative to date.  PLAN: 1. Continue current antibiotics pending final  cultures 2. I have ordered PICC placement  Michel Bickers, [place] for [place] [phone] pager   [phone] cell 10/11/2015, 2:24 PM

## 2015-10-11 NOTE — Progress Notes (Signed)
PROGRESS NOTE  Greg Cook [id number] DOB: 02/06/1966 DOA: 10/08/2015 PCP: No primary care provider on file. Outpatient Specialists:    LOS: 3 days   Brief Narrative: Greg Cook is a 50 year old male who was admitted yesterday for sepsis-like picture, fatigue, cough and decreased appetite for the past 2-1/2 weeks. CT revealed scattered opacities of the left lung, a hepatic lesion and asymmetric thickening of the left colon wall.  Assessment & Plan: Principal Problem:   Liver abscess - by CT Active Problems:   Chills   Fatigue   Pulmonary infiltrates   Cough   Weight loss   Colonic mass   Hepatic abscess   Lung nodules   Smoker   Screen for STD (sexually transmitted disease)   Tobacco abuse    Sepsis due to liver abscess - Patient febrile, tachycardic and tachypneic on admission with CT scan concerning for liver abscess - Interventional Radiology aspirated  - will send for labs. Discussed with IR. - Patient reports no abdominal pain or tenderness. - Currently being treated with ceftriaxone, metronidazole and vancomycin for suspected abscess. - Blood cultures positive for gram-positive, 1 out of 2 bottles, coag negative staph, likely contaminant. Antibiotics per ID  Colonic wall thickening - Neoplastic process vs diverticulitis - will get formal GI input on Monday   Pulmonary infiltrates with cough - Scattered opacities of the left lung concerning for infection, but the opacity in the lingula raised concern for neoplasm. - Denies sputum production but endorses a "dry" cough for the past week. - Antibiotics should cover PNA   Elevated LFTs - related to the liver lesion  Thrombocytopenia - unclear etiology currently, may be related to sepsis vs alcohol abuse.  - no mentions of liver disease on imaging  Oral thrush - Noted on the buccal mucosa and oropharynx. - Patient reports no pain or discomfort - Start nystatin swish and swallow, monitor  response  Alcohol abuse - Patient's mother stated that patient drinks heavily in the weekends - Patient reports drinking 6-12 beers on weekend nights and 1-2 some mornings when he gets off of work. - Unclear as to the accuracy of his drinking claims  - monitor for withdrawal, start CIWA  Elevated INR - FFP per IR during procedure - Awaiting labs for reevaluation.  Tobacco abuse   DVT prophylaxis: Lovenox   Code Status: Full Family Communication: sister bedside Disposition Plan: Home when ready Barriers for discharge: sepsis  Consultants:   ID  IR  Procedures: None  Antimicrobials: Ceftriaxone  2/23 >> Metronidazole  2/23 >> Vancomycin  2/23 >>  Subjective: - no complaints, denies abdominal pain, no nausea/vomiting. Denies shortness of breath, endorses cough  Objective: Filed Vitals:   10/10/15 0606 10/10/15 1616 10/10/15 2102 10/11/15 0519  BP:  143/80 138/90 129/90  Pulse: 116 110 109 115  Temp:  99.2 F (37.3 C) 99.5 F (37.5 C) 98.7 F (37.1 C)  TempSrc:  Oral Oral Oral  Resp:  21 19 18   Height:      Weight:      SpO2: 91% 91% 94% 95%    Intake/Output Summary (Last 24 hours) at 10/11/15 1351 Last data filed at 10/11/15 1034  Gross per 24 hour  Intake     10 ml  Output   3505 ml  Net  -3495 ml   Filed Weights   10/08/15 1022  Weight: 83.462 kg (184 lb)   Examination: BP 129/90 mmHg  Pulse 115  Temp(Src) 98.7 F (37.1 C) (  Oral)  Resp 18  Ht 5\' 8"  (1.727 m)  Wt 83.462 kg (184 lb)  BMI 27.98 kg/m2  SpO2 95% General exam: NAD Respiratory system: Clear. No increased work of breathing. No wheezing, no crackles Cardiovascular system: regular rate and rhythm, no murmurs, gallops. No JVD. No peripheral edema.  Gastrointestinal system: Abdomen is nondistended, soft and nontender. Normal bowel sounds heard. Drain in place. Central nervous system: AxOx3. No focal deficits Extremities: No clubbing/cyanosis Skin: no rashes  Data Reviewed: I have  personally reviewed following labs and imaging studies  CBC:  Recent Labs Lab 10/08/15 0929 10/09/15 0515 10/10/15 0618 10/11/15 0633  WBC 8.1 9.2 7.2 5.5  NEUTROABS 5.8 8.1*  --   --   HGB 13.6 12.7* 11.2* 11.8*  HCT 38.1* 37.2* 33.1* 34.3*  MCV 97.4 98.4 97.9 98.3  PLT 102* 92* 108* 123456*   Basic Metabolic Panel:  Recent Labs Lab 10/08/15 0929 10/09/15 0515 10/10/15 0618 10/11/15 0633  NA 128* 134* 133* 133*  K 2.7* 3.8 3.3* 3.3*  CL 92* 104 104 104  CO2 24 19* 21* 21*  GLUCOSE 161* 99 122* 99  BUN 12 5* <5* <5*  CREATININE 0.75 0.66 0.55* 0.53*  CALCIUM 7.7* 7.7* 7.7* 7.7*   GFR: Estimated Creatinine Clearance: 118.8 mL/min (by C-G formula based on Cr of 0.53). Liver Function Tests:  Recent Labs Lab 10/08/15 0929 10/09/15 0515 10/10/15 0618  AST 55* 53* 42*  ALT 33 30 24  ALKPHOS 140* 137* 113  BILITOT 2.2* 2.4* 1.6*  PROT 7.0 6.4* 5.5*  ALBUMIN 2.2* 1.8* 1.6*   Coagulation Profile:  Recent Labs Lab 10/09/15 1024 10/10/15 0618  INR 1.73* 1.59*   CBG:  Recent Labs Lab 10/08/15 0933  GLUCAP 130*   Urine analysis:    Component Value Date/Time   COLORURINE YELLOW 10/08/2015 1730   APPEARANCEUR CLEAR 10/08/2015 1730   LABSPEC 1.015 10/08/2015 1730   PHURINE 7.0 10/08/2015 1730   GLUCOSEU NEGATIVE 10/08/2015 1730   HGBUR NEGATIVE 10/08/2015 1730   BILIRUBINUR NEGATIVE 10/08/2015 1730   KETONESUR NEGATIVE 10/08/2015 1730   PROTEINUR NEGATIVE 10/08/2015 1730   NITRITE NEGATIVE 10/08/2015 1730   LEUKOCYTESUR NEGATIVE 10/08/2015 1730   Sepsis Labs: Invalid input(s): PROCALCITONIN, LACTICIDVEN  Recent Results (from the past 240 hour(s))  Blood Culture (routine x 2)     Status: None   Collection Time: 10/08/15 10:00 AM  Result Value Ref Range Status   Specimen Description BLOOD LEFT AC  Final   Special Requests BOTTLES DRAWN AEROBIC AND ANAEROBIC 5CC  Final   Culture  Setup Time   Final    GRAM POSITIVE COCCI IN CLUSTERS IN BOTH AEROBIC  AND ANAEROBIC BOTTLES CRITICAL RESULT CALLED TO, READ BACK BY AND VERIFIED WITH: K BRUMAGIN 10/09/15 @ 65 M VESTAL    Culture   Final    STAPHYLOCOCCUS SPECIES (COAGULASE NEGATIVE) THE SIGNIFICANCE OF ISOLATING THIS ORGANISM FROM A SINGLE SET OF BLOOD CULTURES WHEN MULTIPLE SETS ARE DRAWN IS UNCERTAIN. PLEASE NOTIFY THE MICROBIOLOGY DEPARTMENT WITHIN ONE WEEK IF SPECIATION AND SENSITIVITIES ARE REQUIRED. Performed at [facility]    Report Status 10/11/2015 FINAL  Final  Blood Culture (routine x 2)     Status: None (Preliminary result)   Collection Time: 10/08/15 10:20 AM  Result Value Ref Range Status   Specimen Description BLOOD LEFT HAND  Final   Special Requests BOTTLES DRAWN AEROBIC AND ANAEROBIC 5CC  Final   Culture   Final    NO GROWTH  3 DAYS Performed at [facility]    Report Status PENDING  Incomplete  Urine culture     Status: None   Collection Time: 10/08/15  5:30 PM  Result Value Ref Range Status   Specimen Description URINE, CLEAN CATCH  Final   Special Requests NONE  Final   Culture   Final    NO GROWTH 2 DAYS Performed at [facility]    Report Status 10/10/2015 FINAL  Final  Culture, routine-abscess     Status: None (Preliminary result)   Collection Time: 10/09/15  4:58 PM  Result Value Ref Range Status   Specimen Description ABSCESS  Final   Special Requests LIVER  Final   Gram Stain PENDING  Incomplete   Culture   Final    Culture reincubated for better growth Performed at [facility]    Report Status PENDING  Incomplete    Radiology Studies: No results found.   Scheduled Meds: . sodium chloride   Intravenous Once  . cefTRIAXone (ROCEPHIN)  IV  2 g Intravenous Q24H  . enoxaparin (LOVENOX) injection  40 mg Subcutaneous Q24H  . feeding supplement (ENSURE ENLIVE)  237 mL Oral BID BM  . folic acid  1 mg Oral Daily  . metronidazole  500 mg Intravenous 3 times per day  . multivitamin with minerals  1 tablet Oral Daily  .  nystatin  5 mL Oral QID  . thiamine  100 mg Oral Daily   Or  . thiamine  100 mg Intravenous Daily  . vancomycin  1,000 mg Intravenous Q8H   Continuous Infusions:     Marzetta Board, MD, PhD [facility] Pager [phone]  If 7PM-7AM, please contact night-coverage [email or web address] Password [email or web address] 10/11/2015, 1:51 PM

## 2015-10-11 NOTE — Progress Notes (Signed)
Peripherally Inserted Central Catheter/Midline Placement  The IV Nurse has discussed with the patient and/or persons authorized to consent for the patient, the purpose of this procedure and the potential benefits and risks involved with this procedure.  The benefits include less needle sticks, lab draws from the catheter and patient may be discharged home with the catheter.  Risks include, but not limited to, infection, bleeding, blood clot (thrombus formation), and puncture of an artery; nerve damage and irregular heat beat.  Alternatives to this procedure were also discussed.  PICC/Midline Placement Documentation  PICC Double Lumen 10/11/15 PICC Right Brachial 38 cm 0 cm (Active)  Indication for Insertion or Continuance of Line Prolonged intravenous therapies 10/11/2015  4:55 PM  Exposed Catheter (cm) 0 cm 10/11/2015  4:55 PM  Site Assessment Clean;Dry;Intact 10/11/2015  4:55 PM  Lumen #1 Status Flushed;Saline locked;Blood return noted 10/11/2015  4:55 PM  Lumen #2 Status Flushed;Saline locked;Blood return noted 10/11/2015  4:55 PM  Dressing Type Transparent 10/11/2015  4:55 PM  Dressing Status Clean;Dry;Intact;Antimicrobial disc in place 10/11/2015  4:55 PM  Line Care Connections checked and tightened 10/11/2015  4:55 PM  Line Adjustment (NICU/IV Team Only) No 10/11/2015  4:55 PM  Dressing Intervention New dressing 10/11/2015  4:55 PM  Dressing Change Due 10/18/15 10/11/2015  4:55 PM       Rolena Infante 10/11/2015, 4:56 PM

## 2015-10-12 ENCOUNTER — Encounter (HOSPITAL_COMMUNITY): Payer: Self-pay | Admitting: Physician Assistant

## 2015-10-12 DIAGNOSIS — B954 Other streptococcus as the cause of diseases classified elsewhere: Secondary | ICD-10-CM

## 2015-10-12 DIAGNOSIS — R935 Abnormal findings on diagnostic imaging of other abdominal regions, including retroperitoneum: Secondary | ICD-10-CM

## 2015-10-12 LAB — CULTURE, ROUTINE-ABSCESS

## 2015-10-12 MED ORDER — BISACODYL 5 MG PO TBEC
10.0000 mg | DELAYED_RELEASE_TABLET | Freq: Once | ORAL | Status: AC
Start: 2015-10-12 — End: 2015-10-12
  Administered 2015-10-12: 10 mg via ORAL
  Filled 2015-10-12: qty 2

## 2015-10-12 MED ORDER — PEG-KCL-NACL-NASULF-NA ASC-C 100 G PO SOLR
1.0000 | Freq: Once | ORAL | Status: AC
  Administered 2015-10-12: 200 g via ORAL
  Filled 2015-10-12: qty 1

## 2015-10-12 NOTE — Progress Notes (Signed)
Patient ID: Greg Cook, male   DOB: 08-13-1966, 50 y.o.   MRN: [id number]    Referring Physician(s): Rhae Lerner, Trevor  Chief Complaint: Liver abscess  Subjective: Patient complains mostly of back pain today.  Started after his procedure on Friday.  Worse with movement and better when laying still.  Feels like a "pulled muscle"  Allergies: Review of patient's allergies indicates no known allergies.  Medications: Prior to Admission medications   Medication Sig Start Date End Date Taking? Authorizing Provider  Aspirin-Acetaminophen-Caffeine (GOODYS EXTRA STRENGTH) [phone] MG PACK Take 1 packet by mouth 2 (two) times daily as needed. For pain   Yes Historical Provider, MD  ibuprofen (ADVIL,MOTRIN) 200 MG tablet Take 400 mg by mouth every 6 (six) hours as needed.   Yes Historical Provider, MD    Vital Signs: BP 118/87 mmHg  Pulse 110  Temp(Src) 99.4 F (37.4 C) (Oral)  Resp 20  Ht 5\' 8"  (1.727 m)  Wt 184 lb (83.462 kg)  BMI 27.98 kg/m2  SpO2 95%  Physical Exam: Abd: soft, NT, ND, drain with minimal serous drainage, mostly flush.  Only about 5cc in bulb currently.  Drain site is c/d/i  Imaging: Dg Chest 2 View  10/08/2015  CLINICAL DATA:  Cough and congestion since 09/16/2015, vomiting and diarrhea 3 weeks ago but none since, fatigue, tired beginning, ongoing cough, decreased appetite, smoker EXAM: CHEST  2 VIEW COMPARISON:  03/05/2006 FINDINGS: Normal heart size, mediastinal contours and pulmonary vascularity. Central peribronchial thickening. Opacity in LEFT upper lobe question mass versus infiltrate. Linear subsegmental atelectasis RIGHT base. Remaining lungs clear. No pleural effusion or pneumothorax. Bones unremarkable. IMPRESSION: LEFT upper lobe opacity question mass versus focal infiltrate, malignancy not excluded; CT chest with contrast recommended to exclude tumor. Electronically Signed   By: Lavonia Dana M.D.   On: 10/08/2015 09:51   Ct Chest W  Contrast  10/08/2015  CLINICAL DATA:  50 year old with cough and congestion. Fatigue and decreased appetite. Opacity in left upper lobe on recent chest radiograph. EXAM: CT CHEST, ABDOMEN, AND PELVIS WITH CONTRAST TECHNIQUE: Multidetector CT imaging of the chest, abdomen and pelvis was performed following the standard protocol during bolus administration of intravenous contrast. CONTRAST:  32mL OMNIPAQUE IOHEXOL 300 MG/ML SOLN, 1102mL OMNIPAQUE IOHEXOL 300 MG/ML SOLN COMPARISON:  Chest radiograph 10/08/2015 FINDINGS: CT CHEST FINDINGS Mediastinum/Lymph Nodes: Left anterior descending coronary artery is heavily calcified. There is no significant pericardial fluid. Small lymph nodes throughout the mediastinum. Overall, no significant chest lymphadenopathy. No axillary lymphadenopathy. Normal caliber of the thoracic aorta. Lungs/Pleura: The trachea and mainstem bronchi are patent. There is a focal irregular opacity at the lingula measured 2.7 cm on sequence 4, image 47. Few subtle peripheral densities in left upper lobe. Focal parenchymal densities along the bronchovascular distribution in the left upper lobe. This lesion accounts for the recent chest radiograph finding. There is also a subtle pleural-based opacity in the superior segment of left lower lobe on sequence 4, image 28. Musculoskeletal: No acute bone abnormality. No suspicious bone findings. CT ABDOMEN PELVIS FINDINGS Hepatobiliary: There are multiple low-density structures along the posterior medial aspect of the right hepatic lobe. In addition, there is low-density in the surrounding right hepatic lobe. Difficult to measure this process but it roughly measures 5.9 x 6.6 x 8.1 cm. Normal appearance the left hepatic lobe. Main portal venous system is patent. Normal appearance of the gallbladder. Pancreas: Normal appearance of pancreas without inflammation or duct dilatation. Spleen: Normal appearance of spleen without enlargement.  Adrenals/Urinary Tract:  Normal adrenal glands. 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 There is mild-moderate dilatation of the proximal right ureters. There is mild dilatation of the distal right ureter. No evidence for an obstructing stone. Small fluid in the urinary bladder without gross abnormality. No acute inflammatory changes involving the kidneys. Stomach/Bowel: There is oral contrast in the small bowel and colon. There is extensive diverticulosis involving the left colon. Asymmetric wall thickening involving the proximal sigmoid colon on sequence 2, image 97. In addition, there is focal wall thickening in the left colon at the junction of the sigmoid colon and descending colon on sequence 2, image 89 that measures up to 2.0 cm. No significant pericolonic inflammation. No acute abnormality to the stomach or small bowel. Vascular/Lymphatic: Few atherosclerotic calcifications of the abdominal aorta without aneurysm. Plaque involving the external iliac arteries bilaterally. There is no significant abdominal or pelvic lymphadenopathy. Small retroperitoneal lymph nodes along the left side of the aorta. Reproductive: Calcifications in the prostate without significant enlargement. No free fluid. No free air. Small periumbilical hernia containing fat. Other: Intramedullary nail with screw fixation in the proximal right femur. No suspicious bone findings. Musculoskeletal:  No suspicious bone lesions identified. IMPRESSION: There are scattered opacities in the left lung, largest in the left upper lobe. Based on the distribution of disease, favor an infectious etiology. However, the rounded opacity in the lingula also raises concern for a neoplastic process. Recommend follow-up after antibiotic therapy to ensure resolution of these lung findings. Large complex lesion involving the right hepatic lobe. This lesion is multiloculated with low-density or cystic areas. Findings are concerning for a complex hepatic abscess. A neoplastic  process cannot be excluded. The extent of this liver lesion could be better characterized with MRI, with and without contrast. Areas of asymmetric wall thickening involving the left colon at the junction of the descending colon and sigmoid colon. Findings could represent areas of diverticulitis but there is no significant pericolonic inflammation. Colonic neoplasms are also in the differential diagnosis. 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 There is also mild dilatation to the distal right ureter. The right hydronephrosis could be chronic because there is no significant inflammatory changes in this area. However, a distal right ureter lesion cannot be excluded. These results were called by telephone at the time of interpretation on 10/08/2015 at 12:04 pm to Dr. Darl Householder , who verbally acknowledged these results. Electronically Signed   By: Markus Daft M.D.   On: 10/08/2015 12:13   Ct Abdomen Pelvis W Contrast  10/08/2015  CLINICAL DATA:  50 year old with cough and congestion. Fatigue and decreased appetite. Opacity in left upper lobe on recent chest radiograph. EXAM: CT CHEST, ABDOMEN, AND PELVIS WITH CONTRAST TECHNIQUE: Multidetector CT imaging of the chest, abdomen and pelvis was performed following the standard protocol during bolus administration of intravenous contrast. CONTRAST:  45mL OMNIPAQUE IOHEXOL 300 MG/ML SOLN, 123mL OMNIPAQUE IOHEXOL 300 MG/ML SOLN COMPARISON:  Chest radiograph 10/08/2015 FINDINGS: CT CHEST FINDINGS Mediastinum/Lymph Nodes: Left anterior descending coronary artery is heavily calcified. There is no significant pericardial fluid. Small lymph nodes throughout the mediastinum. Overall, no significant chest lymphadenopathy. No axillary lymphadenopathy. Normal caliber of the thoracic aorta. Lungs/Pleura: The trachea and mainstem bronchi are patent. There is a focal irregular opacity at the lingula measured 2.7 cm on sequence 4, image 47. Few subtle peripheral  densities in left upper lobe. Focal parenchymal densities along the bronchovascular distribution in the left upper lobe. This lesion  accounts for the recent chest radiograph finding. There is also a subtle pleural-based opacity in the superior segment of left lower lobe on sequence 4, image 28. Musculoskeletal: No acute bone abnormality. No suspicious bone findings. CT ABDOMEN PELVIS FINDINGS Hepatobiliary: There are multiple low-density structures along the posterior medial aspect of the right hepatic lobe. In addition, there is low-density in the surrounding right hepatic lobe. Difficult to measure this process but it roughly measures 5.9 x 6.6 x 8.1 cm. Normal appearance the left hepatic lobe. Main portal venous system is patent. Normal appearance of the gallbladder. Pancreas: Normal appearance of pancreas without inflammation or duct dilatation. Spleen: Normal appearance of spleen without enlargement. Adrenals/Urinary Tract: Normal adrenal glands. 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 There is mild-moderate dilatation of the proximal right ureters. There is mild dilatation of the distal right ureter. No evidence for an obstructing stone. Small fluid in the urinary bladder without gross abnormality. No acute inflammatory changes involving the kidneys. Stomach/Bowel: There is oral contrast in the small bowel and colon. There is extensive diverticulosis involving the left colon. Asymmetric wall thickening involving the proximal sigmoid colon on sequence 2, image 97. In addition, there is focal wall thickening in the left colon at the junction of the sigmoid colon and descending colon on sequence 2, image 89 that measures up to 2.0 cm. No significant pericolonic inflammation. No acute abnormality to the stomach or small bowel. Vascular/Lymphatic: Few atherosclerotic calcifications of the abdominal aorta without aneurysm. Plaque involving the external iliac arteries bilaterally. There is no significant  abdominal or pelvic lymphadenopathy. Small retroperitoneal lymph nodes along the left side of the aorta. Reproductive: Calcifications in the prostate without significant enlargement. No free fluid. No free air. Small periumbilical hernia containing fat. Other: Intramedullary nail with screw fixation in the proximal right femur. No suspicious bone findings. Musculoskeletal:  No suspicious bone lesions identified. IMPRESSION: There are scattered opacities in the left lung, largest in the left upper lobe. Based on the distribution of disease, favor an infectious etiology. However, the rounded opacity in the lingula also raises concern for a neoplastic process. Recommend follow-up after antibiotic therapy to ensure resolution of these lung findings. Large complex lesion involving the right hepatic lobe. This lesion is multiloculated with low-density or cystic areas. Findings are concerning for a complex hepatic abscess. A neoplastic process cannot be excluded. The extent of this liver lesion could be better characterized with MRI, with and without contrast. Areas of asymmetric wall thickening involving the left colon at the junction of the descending colon and sigmoid colon. Findings could represent areas of diverticulitis but there is no significant pericolonic inflammation. Colonic neoplasms are also in the differential diagnosis. 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 There is also mild dilatation to the distal right ureter. The right hydronephrosis could be chronic because there is no significant inflammatory changes in this area. However, a distal right ureter lesion cannot be excluded. These results were called by telephone at the time of interpretation on 10/08/2015 at 12:04 pm to Dr. Darl Householder , who verbally acknowledged these results. Electronically Signed   By: Markus Daft M.D.   On: 10/08/2015 12:13    Labs:  CBC:  Recent Labs  10/08/15 0929 10/09/15 0515 10/10/15 0618  10/11/15 0633  WBC 8.1 9.2 7.2 5.5  HGB 13.6 12.7* 11.2* 11.8*  HCT 38.1* 37.2* 33.1* 34.3*  PLT 102* 92* 108* 113*    COAGS:  Recent Labs  10/09/15 1024  10/10/15 0618  INR 1.73* 1.59*    BMP:  Recent Labs  10/08/15 0929 10/09/15 0515 10/10/15 0618 10/11/15 0633  NA 128* 134* 133* 133*  K 2.7* 3.8 3.3* 3.3*  CL 92* 104 104 104  CO2 24 19* 21* 21*  GLUCOSE 161* 99 122* 99  BUN 12 5* <5* <5*  CALCIUM 7.7* 7.7* 7.7* 7.7*  CREATININE 0.75 0.66 0.55* 0.53*  GFRNONAA >60 >60 >60 >60  GFRAA >60 >60 >60 >60    LIVER FUNCTION TESTS:  Recent Labs  10/08/15 0929 10/09/15 0515 10/10/15 0618  BILITOT 2.2* 2.4* 1.6*  AST 55* 53* 42*  ALT 33 30 24  ALKPHOS 140* 137* 113  PROT 7.0 6.4* 5.5*  ALBUMIN 2.2* 1.8* 1.6*    Assessment and Plan: 1. Liver abscess, s/p perc drain on 2/24 - Hoss -patient doing well with drain in place -once output is minimal, below 15cc and its been about 5-7 days from drain placement at least, he will need a repeat CT scan to follow up on the fluid collection -cont drain irrigation for now -? Need for muscle relaxants to help some with back discomfort.  Will defer to primary service.  Electronically Signed: Henreitta Cea 10/12/2015, 9:01 AM   I spent a total of 15 Minutes at the the patient's bedside AND on the patient's hospital floor or unit, greater than 50% of which was counseling/coordinating care for liver abscess, s/p perc drain

## 2015-10-12 NOTE — Progress Notes (Signed)
Pharmacy Antibiotic Note  34 YOM with no significant PMH - hasn't seen MD in years - who presented on 2/23 with cough, weight loss, and generalized weakness. Imaging showed a large liver lesion concerning for abscess, colon wall thickening/possible neoplasm, and scattered lung opacities. The patient is being worked up for infection and/or possible cancer with mets.   Plan: 1. Continue Vancomycin 1g IV every 8 hours (Day # 4) 2. Continue  Rocephin to 2g IV every 24 hours for intra-abdominal, abscess, and +BCx 3. Will continue to follow renal function, culture results, LOT, and antibiotic de-escalation plans   Height: 5\' 8"  (172.7 cm) Weight: 184 lb (83.462 kg) IBW/kg (Calculated) : 68.4  Temp (24hrs), Avg:98.5 F (36.9 C), Min:97.9 F (36.6 C), Max:99.4 F (37.4 C)   Recent Labs Lab 10/08/15 0929 10/08/15 1006 10/08/15 1345 10/09/15 0515 10/10/15 0618 10/11/15 0633  WBC 8.1  --   --  9.2 7.2 5.5  CREATININE 0.75  --   --  0.66 0.55* 0.53*  LATICACIDVEN  --  2.95* 1.59  --   --   --     Estimated Creatinine Clearance: 118.8 mL/min (by C-G formula based on Cr of 0.53).    No Known Allergies  Antimicrobials this admission: CTX 2/23 >> Azithro 2/23 >> 2/24 Flagyl 2/23 >> Vanc 2/24 >>  Dose adjustments this admission: n/a  Microbiology results: 2/23 BCx >> 1/2 GPC (coag negative staph) 2/23 UCx >> 2/23 RCx >>  Thank you for allowing pharmacy to be a part of this patient's care. Anette Guarneri, PharmD [phone]  10/12/2015 8:42 AM

## 2015-10-12 NOTE — Progress Notes (Signed)
PROGRESS NOTE  Greg Cook MGQ:[id number] DOB: 1966-02-25 DOA: 10/08/2015 PCP: No primary care provider on file. Outpatient Specialists:    LOS: 4 days   Brief Narrative: Greg Cook is a 50 year old male who was admitted yesterday for sepsis-like picture, fatigue, cough and decreased appetite for the past 2-1/2 weeks. CT revealed scattered opacities of the left lung, a hepatic lesion and asymmetric thickening of the left colon wall.  Assessment & Plan: Principal Problem:   Liver abscess - by CT Active Problems:   Chills   Fatigue   Pulmonary infiltrates   Cough   Weight loss   Colonic mass   Hepatic abscess   Lung nodules   Smoker   Screen for STD (sexually transmitted disease)   Tobacco abuse   Sepsis due to liver abscess - Patient febrile, tachycardic and tachypneic on admission with CT scan concerning for liver abscess - Interventional Radiology aspirated  - will send for labs. Discussed with IR. - Patient reports no abdominal pain or tenderness. - Currently being treated with ceftriaxone, metronidazole and vancomycin for suspected abscess. - Blood cultures positive for gram-positive, 1 out of 2 bottles, coag negative staph, likely contaminant. Antibiotics per ID - Abscess speciating microaerophilic strep, Narrowed to ceftriaxone per infectious disease for 6 weeks   Colonic wall thickening - Neoplastic process vs diverticulitis - GI to scope tomorrow   Pulmonary infiltrates with cough - Scattered opacities of the left lung concerning for infection, but the opacity in the lingula raised concern for neoplasm. - Denies sputum production but endorses a "dry" cough for the past week. - Antibiotics should cover PNA   Elevated LFTs - related to the liver lesion  Thrombocytopenia - unclear etiology currently, may be related to sepsis vs alcohol abuse.  - no mentions of liver disease on imaging  Oral thrush - Noted on the buccal mucosa and oropharynx. - Patient  reports no pain or discomfort - Start nystatin swish and swallow, monitor response  Alcohol abuse - Patient's mother stated that patient drinks heavily in the weekends - Patient reports drinking 6-12 beers on weekend nights and 1-2 some mornings when he gets off of work. - Unclear as to the accuracy of his drinking claims  - monitor for withdrawal, start CIWA  Elevated INR - FFP per IR during procedure - Awaiting labs for reevaluation.  Tobacco abuse   DVT prophylaxis: Lovenox   Code Status: Full Family Communication: discussed with mother over the phone  Disposition Plan: Home when ready Barriers for discharge: sepsis  Consultants:   ID  IR  Procedures: None  Antimicrobials: Ceftriaxone  2/23 >> Metronidazole  2/23 >> Vancomycin  2/23 >>  Subjective: - no complaints, denies abdominal pain, no nausea/vomiting. Denies shortness of breath, endorses cough  Objective: Filed Vitals:   10/11/15 2117 10/12/15 0038 10/12/15 0521 10/12/15 1316  BP: 137/90 130/91 118/87 149/92  Pulse: 102 106 110 112  Temp: 98.2 F (36.8 C) 97.9 F (36.6 C) 99.4 F (37.4 C) 98.5 F (36.9 C)  TempSrc: Oral Oral Oral Oral  Resp: _0 Height:      Weight:      SpO2: 93% 89% 95% 96%    Intake/Output Summary (Last 24 hours) at 10/12/15 1621 Last data filed at 10/12/15 1355  Gross per 24 hour  Intake   1540 ml  Output   2254 ml  Net   -714 ml   Filed Weights   10/08/15 1022  Weight: 83.462  kg (184 lb)   Examination: BP 149/92 mmHg  Pulse 112  Temp(Src) 98.5 F (36.9 C) (Oral)  Resp 19  Ht 5\' 8"  (1.727 m)  Wt 83.462 kg (184 lb)  BMI 27.98 kg/m2  SpO2 96% General exam: NAD Respiratory system: Clear. No increased work of breathing. No wheezing, no crackles Cardiovascular system: regular rate and rhythm, no murmurs, gallops. No JVD. No peripheral edema.  Gastrointestinal system: Abdomen is nondistended, soft and nontender. Normal bowel sounds heard. Drain in  place. Central nervous system: AxOx3. No focal deficits Extremities: No clubbing/cyanosis Skin: no rashes  Data Reviewed: I have personally reviewed following labs and imaging studies  CBC:  Recent Labs Lab 10/08/15 0929 10/09/15 0515 10/10/15 0618 10/11/15 0633  WBC 8.1 9.2 7.2 5.5  NEUTROABS 5.8 8.1*  --   --   HGB 13.6 12.7* 11.2* 11.8*  HCT 38.1* 37.2* 33.1* 34.3*  MCV 97.4 98.4 97.9 98.3  PLT 102* 92* 108* 113*   Basic Metabolic Panel:  Recent Labs Lab 10/08/15 0929 10/09/15 0515 10/10/15 0618 10/11/15 0633  NA 128* 134* 133* 133*  K 2.7* 3.8 3.3* 3.3*  CL 92* 104 104 104  CO2 24 19* 21* 21*  GLUCOSE 161* 99 122* 99  BUN 12 5* <5* <5*  CREATININE 0.75 0.66 0.55* 0.53*  CALCIUM 7.7* 7.7* 7.7* 7.7*   GFR: Estimated Creatinine Clearance: 118.8 mL/min (by C-G formula based on Cr of 0.53). Liver Function Tests:  Recent Labs Lab 10/08/15 0929 10/09/15 0515 10/10/15 0618  AST 55* 53* 42*  ALT 33 30 24  ALKPHOS 140* 137* 113  BILITOT 2.2* 2.4* 1.6*  PROT 7.0 6.4* 5.5*  ALBUMIN 2.2* 1.8* 1.6*   Coagulation Profile:  Recent Labs Lab 10/09/15 1024 10/10/15 0618  INR 1.73* 1.59*   CBG:  Recent Labs Lab 10/08/15 0933  GLUCAP 130*   Urine analysis:    Component Value Date/Time   COLORURINE YELLOW 10/08/2015 1730   APPEARANCEUR CLEAR 10/08/2015 1730   LABSPEC 1.015 10/08/2015 1730   PHURINE 7.0 10/08/2015 1730   GLUCOSEU NEGATIVE 10/08/2015 1730   HGBUR NEGATIVE 10/08/2015 1730   BILIRUBINUR NEGATIVE 10/08/2015 1730   KETONESUR NEGATIVE 10/08/2015 1730   PROTEINUR NEGATIVE 10/08/2015 1730   NITRITE NEGATIVE 10/08/2015 1730   LEUKOCYTESUR NEGATIVE 10/08/2015 1730   Sepsis Labs: Invalid input(s): PROCALCITONIN, LACTICIDVEN  Recent Results (from the past 240 hour(s))  Blood Culture (routine x 2)     Status: None   Collection Time: 10/08/15 10:00 AM  Result Value Ref Range Status   Specimen Description BLOOD LEFT AC  Final   Special  Requests BOTTLES DRAWN AEROBIC AND ANAEROBIC 5CC  Final   Culture  Setup Time   Final    GRAM POSITIVE COCCI IN CLUSTERS IN BOTH AEROBIC AND ANAEROBIC BOTTLES CRITICAL RESULT CALLED TO, READ BACK BY AND VERIFIED WITH: K BRUMAGIN 10/09/15 @ 1013 M VESTAL    Culture   Final    STAPHYLOCOCCUS SPECIES (COAGULASE NEGATIVE) THE SIGNIFICANCE OF ISOLATING THIS ORGANISM FROM A SINGLE SET OF BLOOD CULTURES WHEN MULTIPLE SETS ARE DRAWN IS UNCERTAIN. PLEASE NOTIFY THE MICROBIOLOGY DEPARTMENT WITHIN ONE WEEK IF SPECIATION AND SENSITIVITIES ARE REQUIRED. Performed at [facility]    Report Status 10/11/2015 FINAL  Final  Blood Culture (routine x 2)     Status: None (Preliminary result)   Collection Time: 10/08/15 10:20 AM  Result Value Ref Range Status   Specimen Description BLOOD LEFT HAND  Final   Special Requests BOTTLES  DRAWN AEROBIC AND ANAEROBIC 5CC  Final   Culture   Final    NO GROWTH 4 DAYS Performed at [facility]    Report Status PENDING  Incomplete  Urine culture     Status: None   Collection Time: 10/08/15  5:30 PM  Result Value Ref Range Status   Specimen Description URINE, CLEAN CATCH  Final   Special Requests NONE  Final   Culture   Final    NO GROWTH 2 DAYS Performed at [facility]    Report Status 10/10/2015 FINAL  Final  Culture, routine-abscess     Status: None   Collection Time: 10/09/15  4:58 PM  Result Value Ref Range Status   Specimen Description ABSCESS  Final   Special Requests LIVER  Final   Gram Stain   Final    ABUNDANT WBC PRESENT,BOTH PMN AND MONONUCLEAR FEW SQUAMOUS EPITHELIAL CELLS PRESENT MODERATE GRAM POSITIVE COCCI IN CHAINS Performed at [facility]    Culture   Final    MODERATE MICROAEROPHILIC STREPTOCOCCI Note: Standardized susceptibility testing for this organism is not available. Performed at [facility]    Report Status 10/12/2015 FINAL  Final    Radiology Studies: No results found.   Scheduled  Meds: . sodium chloride   Intravenous Once  . cefTRIAXone (ROCEPHIN)  IV  2 g Intravenous Q24H  . feeding supplement (ENSURE ENLIVE)  237 mL Oral BID BM  . folic acid  1 mg Oral Daily  . metronidazole  500 mg Intravenous 3 times per day  . multivitamin with minerals  1 tablet Oral Daily  . nystatin  5 mL Oral QID  . peg 3350 powder  1 kit Oral Once  . thiamine  100 mg Oral Daily  . vancomycin  1,000 mg Intravenous Q8H   Continuous Infusions:     Marzetta Board, MD, PhD [facility] Pager [phone]  If 7PM-7AM, please contact night-coverage [email or web address] Password [facility] 10/12/2015, 4:21 PM

## 2015-10-12 NOTE — Consult Note (Signed)
[facility] Consult: 11:22 AM 10/12/2015  LOS: 4 days    Referring Provider: Dr Cruzita Lederer.   Primary Care Physician:  No primary care provider on file. Primary Gastroenterologist:  Althia Forts.      Reason for Consultation:  Abnormal left colon.    HPI: Greg Cook is a 50 y.o. male.  S/p ex lap for SBO ~1983 (age 59).  2007 hx of traumatic right femur fracture, s/p ORIF. Previous ACL surgery.    Pt generally healthy.  On Feb 5th began with malaise.  A couple of days later, had onset chills, non-bloody diarrhea.  Diarrhea resolved but progressive fatigue, malaise and ongoing chills.  Never had abd pain, n/v.    Presented to ED 10/08/15.  CT chest, abd, pelvis 2/23:  Multiple low-density structures along the posterior medial aspect of the right hepatic lobe. In addition, there is low-density in the surrounding right hepatic lobe. Difficult to measure this process but it roughly measures 5.9 x 6.6 x 8.1 cm. Normal appearance the left hepatic lobe. Main portal venous system is patent. Normal appearance of the gallbladder. Pancreas: Normal appearance of pancreas without inflammation or duct dilatation. Spleen: Normal appearance of spleen without enlargement Extensive diverticulosis involving the left colon. Asymmetric wall thickening involving the proximal sigmoid colon on sequence 2, image 97. In addition, there is focal wall thickening in the left colon at the junction of the sigmoid colon and descending colon on sequence 2, image 89 that measures up to 2.0 cm. No significant pericolonic inflammation. No acute abnormality to the stomach or small bowel IMPRESSION: There are scattered opacities in the left lung, largest in the left upper lobe. Based on the distribution of disease, favor an infectious  etiology. However, the rounded opacity in the lingula also raises concern for a neoplastic process. Recommend follow-up after antibiotic therapy to ensure resolution of these lung findings. Large complex lesion involving the right hepatic lobe. This lesion is multiloculated with low-density or cystic areas. Findings are concerning for a complex hepatic abscess. A neoplastic process cannot be excluded. The extent of this liver lesion could be better characterized with MRI, with and without contrast. Areas of asymmetric wall thickening involving the left colon at the junction of the descending colon and sigmoid colon. Findings could represent areas of diverticulitis but there is no significant pericolonic inflammation. Colonic neoplasms are also in the differential diagnosis. 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 There is also mild dilatation to the distal right ureter. The right hydronephrosis could be chronic because there is no significant inflammatory changes in this area. However, a distal right ureter lesion cannot be excluded.   S/p perc drainage of pus from liver abscess.  Thus far clx of blood, urine, liver pus are all negative.  HIV and Hep C Ab negative.  WBCs normal, T max 100.7.  T bili 2.2,  AST/ALT 55/33, alk phos 140.  Albumin 1.6.  Some tachycardia into 1teens  Pt denies any GI issues in recent months.    Past Medical History  Diagnosis  Date  . Thrombocytopenia ([place]) 09/2015  . Liver abscess 09/2015    S/P PERC DRAINAGE.   Marland Kitchen Hydronephrosis 09/2015.     RIGHT  . Diverticulosis of colon 09/2015    LEFT COLON    Past Surgical History  Procedure Laterality Date  . Exploratory laparotomy  [phone]    for SBO at age 50  . Orif femur fracture  2007  . Anterior cruciate ligament repair  BEFORE 2007  . Dental extractions      edentulous, wears full set dentures.     Prior to Admission medications   Medication Sig Start Date End Date Taking?  Authorizing Provider  Aspirin-Acetaminophen-Caffeine (GOODYS EXTRA STRENGTH) [phone] MG PACK Take 1 packet by mouth 2 (two) times daily as needed. For pain   Yes Historical Provider, MD  ibuprofen (ADVIL,MOTRIN) 200 MG tablet Take 400 mg by mouth every 6 (six) hours as needed.   Yes Historical Provider, MD    Scheduled Meds: . sodium chloride   Intravenous Once  . cefTRIAXone (ROCEPHIN)  IV  2 g Intravenous Q24H  . enoxaparin (LOVENOX) injection  40 mg Subcutaneous Q24H  . feeding supplement (ENSURE ENLIVE)  237 mL Oral BID BM  . folic acid  1 mg Oral Daily  . metronidazole  500 mg Intravenous 3 times per day  . multivitamin with minerals  1 tablet Oral Daily  . nystatin  5 mL Oral QID  . thiamine  100 mg Oral Daily  . vancomycin  1,000 mg Intravenous Q8H   Infusions:   PRN Meds: acetaminophen **OR** acetaminophen, LORazepam **OR** LORazepam, ondansetron **OR** ondansetron (ZOFRAN) IV, sodium chloride flush, traMADol   Allergies as of 10/08/2015  . (No Known Allergies)    History reviewed. No pertinent family history.  Denies colon or stomach disease.   Social History   Social History  . Marital Status: Single    Spouse Name: N/A  . Number of Children: N/A  . Years of Education: N/A   Occupational History  . Not on file.   Social History Main Topics  . Smoking status: Current Every Day Smoker  . Smokeless tobacco: Not on file  . Alcohol Use: Not on file  . Drug Use: Not on file  . Sexual Activity: Not on file   Other Topics Concern  . Not on file   Social History Narrative  Lives with his 28 year old son Works as a Furniture conservator/restorer Most ETOH consumed: 12 pack in 24 hours.  Not a daily etoh consumer.   REVIEW OF SYSTEMS: Constitutional:  Stable weight.  + fatigue: improved ENT:  No nose bleeds Pulm:  No cough or dyspnea.  Smokes 1PPD CV:  No palpitations, no LE edema.  GU:  No hematuria, no frequency GI:  Per HPI Heme:  No issues with escessive bleeding or  bruising   Transfusions:  None ever.  Neuro:  No headaches, no peripheral tingling or numbness Derm:  No itching, no rash or sores.  Endocrine:  No sweats or chills.  No polyuria or dysuria Immunization:  No flu shot/pneumovax documented.  Travel:  None beyond local counties in last few months.    PHYSICAL EXAM: Vital signs in last 24 hours: Filed Vitals:   10/12/15 0038 10/12/15 0521  BP: 130/91 118/87  Pulse: 106 110  Temp: 97.9 F (36.6 C) 99.4 F (37.4 C)  Resp: 18 20   Wt Readings from Last 3 Encounters:  10/08/15 83.462 kg (184 lb)    General: pleasant,  looks well.  Head:  No asymmetry or swelling  Eyes:  No icterus or pallor Ears:  Not HOH  Nose:  No discharge or congestion Mouth:  Clear, moist, full dentures in place, not removed for exam Neck:  No mass, no TMG, no JVD Lungs:  Clear bil.  No cough or labored resps Heart: RRR.  No mrg, S1/S2 audible Abdomen:  Soft, NT, ND.  Active BS.  No HSM or masses, no bruits.   Rectal: deferred.    Musc/Skeltl: no joint erythema or swelling.  Extremities:  No CCE  Neurologic:  No tremor or limb weakness.  Fully alert and oriented Skin:  No rash, sores.  No telangectasia Tattoos:  None see Nodes:  No cervical adenopathy   Psych:  Pleasant, cooperative, calm.   Intake/Output from previous day: 02/26 0701 - 02/27 0700 In: 1310 [IV Piggyback:1300] Out: 3704 [Urine:3700; Drains:4] Intake/Output this shift: Total I/O In: 110 [I.V.:110] Out: -   LAB RESULTS:  Recent Labs  10/10/15 0618 10/11/15 0633  WBC 7.2 5.5  HGB 11.2* 11.8*  HCT 33.1* 34.3*  PLT 108* 113*   BMET Lab Results  Component Value Date   NA 133* 10/11/2015   NA 133* 10/10/2015   NA 134* 10/09/2015   K 3.3* 10/11/2015   K 3.3* 10/10/2015   K 3.8 10/09/2015   CL 104 10/11/2015   CL 104 10/10/2015   CL 104 10/09/2015   CO2 21* 10/11/2015   CO2 21* 10/10/2015   CO2 19* 10/09/2015   GLUCOSE 99 10/11/2015   GLUCOSE 122* 10/10/2015   GLUCOSE  99 10/09/2015   BUN <5* 10/11/2015   BUN <5* 10/10/2015   BUN 5* 10/09/2015   CREATININE 0.53* 10/11/2015   CREATININE 0.55* 10/10/2015   CREATININE 0.66 10/09/2015   CALCIUM 7.7* 10/11/2015   CALCIUM 7.7* 10/10/2015   CALCIUM 7.7* 10/09/2015   LFT  Recent Labs  10/10/15 0618  PROT 5.5*  ALBUMIN 1.6*  AST 42*  ALT 24  ALKPHOS 113  BILITOT 1.6*   PT/INR Lab Results  Component Value Date   INR 1.59* 10/10/2015   INR 1.73* 10/09/2015   Hepatitis Panel No results for input(s): HEPBSAG, HCVAB, HEPAIGM, HEPBIGM in the last 72 hours. C-Diff No components found for: CDIFF Lipase  No results found for: LIPASE  Drugs of Abuse  No results found for: LABOPIA, COCAINSCRNUR, LABBENZ, AMPHETMU, THCU, LABBARB   RADIOLOGY STUDIES: No results found.  ENDOSCOPIC STUDIES: None ever  IMPRESSION:   *  Left colon thickening at descending/sigmoid junction.  Nothing in pt's story to suggest current or previous diverticulitis.   *  Liver abscess.  S/p 2/24 perc drain.   *  Remote ex lap for SBO, sounds like he had LOA but no bowel resection.   *  Lung opacities, lingular opacity concerning for neoplasm.   *  Right hydronephrosis, bifid right renal collecting system, dilated distal ureter..   *  Hyponatremia.  Hypokalemia.   *  Thrombocytopenia.   *  Hypoalbuminemia and hypocalcemia.     PLAN:     *  Per Dr Henrene Pastor. Colonoscopy at 10:30 AM 2/28. Prep ordered.   I discontinued next dose of Lovenox, can restart after colonosopy.    Azucena Freed  10/12/2015, 11:22 AM Pager: [phone]  GI ATTENDING  History, laboratories, x-rays reviewed. Patient personally seen and examined. Mother in room. Agree with comprehensive consultation note as outlined above. Patient is being treated for symptomatic liver abscess. CT imaging suggested asymmetric  thickening of the left colon. This needs evaluated, to rule out neoplasia. Plan colonoscopy tomorrow.The nature of the procedure, as well  as the risks, benefits, and alternatives were carefully and thoroughly reviewed with the patient. Ample time for discussion and questions allowed. The patient understood, was satisfied, and agreed to proceed.  Docia Chuck. Geri Seminole., M.D. [facility] Division of Gastroenterology

## 2015-10-12 NOTE — Progress Notes (Signed)
Nutrition Follow-up  DOCUMENTATION CODES:   Not applicable  INTERVENTION:   -D/c Ensure Enlive po BID, each supplement provides 350 kcal and 20 grams of protein, due to poor acceptance -Snacks TID  NUTRITION DIAGNOSIS:   Inadequate oral intake related to altered GI function as evidenced by per patient/family report.  Progressing  GOAL:   Patient will meet greater than or equal to 90% of their needs  Progressing  MONITOR:   PO intake, Labs, Weight trends, Skin, I & O's  REASON FOR ASSESSMENT:   Malnutrition Screening Tool    ASSESSMENT:   Greg Cook is a 50 y.o. male with no specific PMH, hasn't seen a doctor for years , presented with cough, weakness, loss of appetite and losing weight over the last 3 weeks. Patient provides the history. Patient was fine until around the time of the Super Bowl , about 2.5 weeks ago. He became ill with nausea and vomiting for a day, then had diarrhea for about 3-4 days. No bloody stool or fevers initially. The n/v/d resolved and he thought he would be getting better but he began to feel very tired during the day, a symptom which would come and go. This went on for the next week or so and has progressively gotten worse, the fatigue. He developed a cough less than 1 week ago and also has had several episodes of chills over the past 5-7 days. He denies purulent sputum production, abd pain, bloody stool, high fevers.   Pt underwent perc drain for liver abscess on 10/09/15. Reviewed GI note from 10/12/15; potential for colonoscopy.   Pt underwent PICC placement on 10/12/15. ID following; abscess cultures negative but final cultures are pending. He is receiving IV antibiotics.   Case discussed with RN. She reports pt has a fair appetite; he consumed cheerios for breakfast this morning. He does not like the taste of Ensure and has been refusing it. RD will will d/c due to poor acceptance.   Labs reviewed: Na: 133 (on IV supplementation), K:  3.3.   Diet Order:  Diet regular Room service appropriate?: Yes; Fluid consistency:: Thin  Skin:  Reviewed, no issues  Last BM:  10/11/15  Height:   Ht Readings from Last 1 Encounters:  10/08/15 5\' 8"  (1.727 m)    Weight:   Wt Readings from Last 1 Encounters:  10/08/15 184 lb (83.462 kg)    Ideal Body Weight:  70 kg  BMI:  Body mass index is 27.98 kg/(m^2).  Estimated Nutritional Needs:   Kcal:  1900-2100  Protein:  100-110 grams  Fluid:  1.9-2.1 L  EDUCATION NEEDS:   No education needs identified at this time  Loletha Bertini A. Jimmye Norman, RD, LDN, CDE Pager: [phone] After hours Pager: [phone]

## 2015-10-12 NOTE — Progress Notes (Addendum)
Subjective: No new complaints   Antibiotics:  Anti-infectives    Start     Dose/Rate Route Frequency Ordered Stop   10/10/15 1100  cefTRIAXone (ROCEPHIN) 2 g in dextrose 5 % 50 mL IVPB     2 g 100 mL/hr over 30 Minutes Intravenous Every 24 hours 10/09/15 1045     10/09/15 1200  vancomycin (VANCOCIN) IVPB 1000 mg/200 mL premix     1,000 mg 200 mL/hr over 60 Minutes Intravenous Every 8 hours 10/09/15 1049     10/09/15 1100  cefTRIAXone (ROCEPHIN) 1 g in dextrose 5 % 50 mL IVPB  Status:  Discontinued     1 g 100 mL/hr over 30 Minutes Intravenous Every 24 hours 10/08/15 1044 10/09/15 1045   10/09/15 1100  azithromycin (ZITHROMAX) 500 mg in dextrose 5 % 250 mL IVPB  Status:  Discontinued     500 mg 250 mL/hr over 60 Minutes Intravenous Every 24 hours 10/08/15 1044 10/09/15 0823   10/08/15 2300  metroNIDAZOLE (FLAGYL) IVPB 500 mg     500 mg 100 mL/hr over 60 Minutes Intravenous 3 times per day 10/08/15 2220     10/08/15 1215  metroNIDAZOLE (FLAGYL) IVPB 500 mg     500 mg 100 mL/hr over 60 Minutes Intravenous  Once 10/08/15 1209 10/08/15 1341   10/08/15 1040  azithromycin (ZITHROMAX) 500 MG injection    Comments:  Greg Cook   : cabinet override      10/08/15 1040 10/08/15 2244   10/08/15 1030  cefTRIAXone (ROCEPHIN) 1 g in dextrose 5 % 50 mL IVPB     1 g 100 mL/hr over 30 Minutes Intravenous  Once 10/08/15 1024 10/08/15 1133   10/08/15 1030  azithromycin (ZITHROMAX) 500 mg in dextrose 5 % 250 mL IVPB     500 mg 250 mL/hr over 60 Minutes Intravenous  Once 10/08/15 1024 10/08/15 1202      Medications: Scheduled Meds: . sodium chloride   Intravenous Once  . cefTRIAXone (ROCEPHIN)  IV  2 g Intravenous Q24H  . enoxaparin (LOVENOX) injection  40 mg Subcutaneous Q24H  . feeding supplement (ENSURE ENLIVE)  237 mL Oral BID BM  . folic acid  1 mg Oral Daily  . metronidazole  500 mg Intravenous 3 times per day  . multivitamin with minerals  1 tablet Oral Daily  . nystatin   5 mL Oral QID  . thiamine  100 mg Oral Daily  . vancomycin  1,000 mg Intravenous Q8H   Continuous Infusions:  PRN Meds:.acetaminophen **OR** acetaminophen, LORazepam **OR** LORazepam, ondansetron **OR** ondansetron (ZOFRAN) IV, sodium chloride flush, traMADol    Objective: Weight change:   Intake/Output Summary (Last 24 hours) at 10/12/15 1423 Last data filed at 10/12/15 1355  Gross per 24 hour  Intake   1540 ml  Output   3054 ml  Net  -1514 ml   Blood pressure 149/92, pulse 112, temperature 98.5 F (36.9 C), temperature source Oral, resp. rate 19, height 5\' 8"  (1.727 m), weight 184 lb (83.462 kg), SpO2 96 %. Temp:  [97.9 F (36.6 C)-99.4 F (37.4 C)] 98.5 F (36.9 C) (02/27 1316) Pulse Rate:  [102-112] 112 (02/27 1316) Resp:  [18-20] 19 (02/27 1316) BP: (118-149)/(87-92) 149/92 mmHg (02/27 1316) SpO2:  [89 %-96 %] 96 % (02/27 1316)  Physical Exam: General: Alert and awake, oriented x3, not in any acute distress. HEENT: anicteric sclera, EOMI, oropharynx clear and without exudate Cardiovascular: regular rate, normal r, no  murmur rubs or gallops Pulmonary: clear to auscultation bilaterally, no wheezing, rales or rhonchi Gastrointestinal: Protuberant abdomen with some tenderness in the right upper quadrant positive bowel sounds and soft no rebound Drain in place with milky material in drain  Musculoskeletal: no clubbing or edema noted bilaterally Skin, soft tissue: no rashes Neuro: nonfocal, strength and sensation intact  CBC:  CBC Latest Ref Rng 10/11/2015 10/10/2015 10/09/2015  WBC 4.0 - 10.5 K/uL 5.5 7.2 9.2  Hemoglobin 13.0 - 17.0 g/dL 11.8(L) 11.2(L) 12.7(L)  Hematocrit 39.0 - 52.0 % 34.3(L) 33.1(L) 37.2(L)  Platelets 150 - 400 K/uL 113(L) 108(L) 92(L)       BMET  Recent Labs  10/10/15 0618 10/11/15 0633  NA 133* 133*  K 3.3* 3.3*  CL 104 104  CO2 21* 21*  GLUCOSE 122* 99  BUN <5* <5*  CREATININE 0.55* 0.53*  CALCIUM 7.7* 7.7*     Liver  Panel   Recent Labs  10/10/15 0618  PROT 5.5*  ALBUMIN 1.6*  AST 42*  ALT 24  ALKPHOS 113  BILITOT 1.6*       Sedimentation Rate  Recent Labs  10/10/15 0618  ESRSEDRATE 107*   C-Reactive Protein  Recent Labs  10/10/15 0618  CRP 19.5*    Micro Results: Recent Results (from the past 720 hour(s))  Blood Culture (routine x 2)     Status: None   Collection Time: 10/08/15 10:00 AM  Result Value Ref Range Status   Specimen Description BLOOD LEFT AC  Final   Special Requests BOTTLES DRAWN AEROBIC AND ANAEROBIC 5CC  Final   Culture  Setup Time   Final    GRAM POSITIVE COCCI IN CLUSTERS IN BOTH AEROBIC AND ANAEROBIC BOTTLES CRITICAL RESULT CALLED TO, READ BACK BY AND VERIFIED WITH: K BRUMAGIN 10/09/15 @ 60 M VESTAL    Culture   Final    STAPHYLOCOCCUS SPECIES (COAGULASE NEGATIVE) THE SIGNIFICANCE OF ISOLATING THIS ORGANISM FROM A SINGLE SET OF BLOOD CULTURES WHEN MULTIPLE SETS ARE DRAWN IS UNCERTAIN. PLEASE NOTIFY THE MICROBIOLOGY DEPARTMENT WITHIN ONE WEEK IF SPECIATION AND SENSITIVITIES ARE REQUIRED. Performed at [facility]    Report Status 10/11/2015 FINAL  Final  Blood Culture (routine x 2)     Status: None (Preliminary result)   Collection Time: 10/08/15 10:20 AM  Result Value Ref Range Status   Specimen Description BLOOD LEFT HAND  Final   Special Requests BOTTLES DRAWN AEROBIC AND ANAEROBIC 5CC  Final   Culture   Final    NO GROWTH 4 DAYS Performed at [facility]    Report Status PENDING  Incomplete  Urine culture     Status: None   Collection Time: 10/08/15  5:30 PM  Result Value Ref Range Status   Specimen Description URINE, CLEAN CATCH  Final   Special Requests NONE  Final   Culture   Final    NO GROWTH 2 DAYS Performed at [facility]    Report Status 10/10/2015 FINAL  Final  Culture, routine-abscess     Status: None   Collection Time: 10/09/15  4:58 PM  Result Value Ref Range Status   Specimen Description ABSCESS   Final   Special Requests LIVER  Final   Gram Stain   Final    ABUNDANT WBC PRESENT,BOTH PMN AND MONONUCLEAR FEW SQUAMOUS EPITHELIAL CELLS PRESENT MODERATE GRAM POSITIVE COCCI IN CHAINS Performed at [facility]    Culture   Final    MODERATE MICROAEROPHILIC STREPTOCOCCI Note: Standardized susceptibility  testing for this organism is not available. Performed at [facility]    Report Status 10/12/2015 FINAL  Final    Studies/Results: No results found.    Assessment/Plan:  INTERVAL HISTORY:  10/10/2015 through 10/12/2015 IR was able to obtain  purulent material from the patient's liver abscess. GI consult in our planning colonoscopy   Principal Problem:   Liver abscess - by CT Active Problems:   Chills   Fatigue   Pulmonary infiltrates   Cough   Weight loss   Colonic mass   Hepatic abscess   Lung nodules   Smoker   Screen for STD (sexually transmitted disease)   Tobacco abuse    Greg Cook is a 50 y.o. male with liver abscess, pulmonary nodules and colonic thickening  #1 Liver abscess:  He is growing micro-aerophilic streptococci  These are typically S to beta lactams  Continue IV ceftriaxone   A reasonable regimen would be 6 weeks of IV ceftriaxone   I would repeat CT abdomen PRIOR to stopping IV abx  I would check panorex to ensure no occult dental infection that requires attention of an oral surgeon. Source could also be GI tract of course  I would also get  CT chest with CT abdomen for   This for # 2   #2 Lung nodules: Could be infectious versus malignant:  Could consider pulmonary consultation I would recommend regardless repeating CT of the chest in roughly a month's time  To see if these lesions ever spotted to antibiotic therapy  # 3 Colonic lesion: patient to undergo endoscopy tomorrow  IV antibiotic regimen:  CEFTRIAXONE 2 GRAMS DAILY  Diagnosis:  Liver abscess  Culture Result:   Microaerophilic  streptococci  No Known Allergies  Discharge antibiotics:  Ceftriaxone 2 grams daily   Duration:  6 weeks  End Date:  April 6th   [facility] Care Per Protocol:  Labs weekly while on IV antibiotics: x__ CBC with differential x__ CMP  Fax weekly labs to [phone]  Clinic Follow Up Appt:  3 weeks  I will otherwise sign off for now  Please call with further questions.         LOS: 4 days   Alcide Evener 10/12/2015, 2:23 PM

## 2015-10-13 ENCOUNTER — Inpatient Hospital Stay (HOSPITAL_COMMUNITY): Payer: Commercial Managed Care - PPO | Admitting: Anesthesiology

## 2015-10-13 ENCOUNTER — Encounter (HOSPITAL_COMMUNITY)
Admission: EM | Disposition: A | Discharge status: Home Health | Payer: Self-pay | Source: Home / Self Care | Attending: Internal Medicine

## 2015-10-13 ENCOUNTER — Encounter (HOSPITAL_COMMUNITY): Payer: Self-pay | Admitting: Anesthesiology

## 2015-10-13 DIAGNOSIS — K5730 Diverticulosis of large intestine without perforation or abscess without bleeding: Secondary | ICD-10-CM

## 2015-10-13 DIAGNOSIS — D120 Benign neoplasm of cecum: Secondary | ICD-10-CM

## 2015-10-13 DIAGNOSIS — D122 Benign neoplasm of ascending colon: Secondary | ICD-10-CM

## 2015-10-13 HISTORY — PX: COLONOSCOPY: SHX5424

## 2015-10-13 LAB — CULTURE, BLOOD (ROUTINE X 2): CULTURE: NO GROWTH

## 2015-10-13 LAB — HIV ANTIBODY (ROUTINE TESTING W REFLEX): HIV Screen 4th Generation wRfx: NONREACTIVE

## 2015-10-13 SURGERY — COLONOSCOPY
Anesthesia: Monitor Anesthesia Care

## 2015-10-13 MED ORDER — SODIUM CHLORIDE 0.9 % IV SOLN
INTRAVENOUS | Status: DC
  Administered 2015-10-13: 500 mL via INTRAVENOUS

## 2015-10-13 MED ORDER — LIDOCAINE HCL (CARDIAC) 20 MG/ML IV SOLN
INTRAVENOUS | Status: DC | PRN
  Administered 2015-10-13 (×2): 50 mg via INTRATRACHEAL

## 2015-10-13 MED ORDER — PROPOFOL 10 MG/ML IV BOLUS
INTRAVENOUS | Status: DC | PRN
  Administered 2015-10-13: 20 mg via INTRAVENOUS
  Administered 2015-10-13 (×2): 25 mg via INTRAVENOUS
  Administered 2015-10-13: 20 mg via INTRAVENOUS
  Administered 2015-10-13: 25 mg via INTRAVENOUS

## 2015-10-13 MED ORDER — PROPOFOL 500 MG/50ML IV EMUL
INTRAVENOUS | Status: DC | PRN
  Administered 2015-10-13: 125 ug/kg/min via INTRAVENOUS

## 2015-10-13 MED ORDER — LACTATED RINGERS IV SOLN
INTRAVENOUS | Status: DC | PRN
  Administered 2015-10-13: 11:00:00 via INTRAVENOUS

## 2015-10-13 MED ORDER — PNEUMOCOCCAL VAC POLYVALENT 25 MCG/0.5ML IJ INJ
0.5000 mL | INJECTION | INTRAMUSCULAR | Status: DC
  Filled 2015-10-13: qty 0.5

## 2015-10-13 MED ORDER — SODIUM CHLORIDE 0.9 % IV SOLN
INTRAVENOUS | Status: DC
  Administered 2015-10-13: 08:00:00 via INTRAVENOUS

## 2015-10-13 NOTE — Anesthesia Preprocedure Evaluation (Addendum)
Anesthesia Evaluation  Patient identified by MRN, date of birth, ID band Patient awake    Reviewed: Allergy & Precautions, NPO status , Patient's Chart, lab work & pertinent test results  Airway Mallampati: III  TM Distance: >3 FB     Dental  (+) Lower Dentures, Upper Dentures   Pulmonary Current Smoker,    Pulmonary exam normal breath sounds clear to auscultation       Cardiovascular negative cardio ROS Normal cardiovascular exam Rhythm:Regular Rate:Normal     Neuro/Psych negative neurological ROS  negative psych ROS   GI/Hepatic Liver abscess Colon mass   Endo/Other    Renal/GU Renal diseaseRight Hydronephrosis  negative genitourinary   Musculoskeletal negative musculoskeletal ROS (+)   Abdominal   Peds  Hematology  (+) anemia , Thrombocytopenia   Anesthesia Other Findings   Reproductive/Obstetrics                            Anesthesia Physical Anesthesia Plan  ASA: III  Anesthesia Plan: MAC   Post-op Pain Management:    Induction: Intravenous  Airway Management Planned: Natural Airway and Nasal Cannula  Additional Equipment:   Intra-op Plan:   Post-operative Plan:   Informed Consent: I have reviewed the patients History and Physical, chart, labs and discussed the procedure including the risks, benefits and alternatives for the proposed anesthesia with the patient or authorized representative who has indicated his/her understanding and acceptance.   Dental advisory given  Plan Discussed with: CRNA, Anesthesiologist and Surgeon  Anesthesia Plan Comments:         Anesthesia Quick Evaluation

## 2015-10-13 NOTE — Progress Notes (Signed)
PROGRESS NOTE  Greg Cook [id number] DOB: Sep 04, 1965 DOA: 10/08/2015 PCP: No primary care provider on file. Outpatient Specialists:    LOS: 5 days   Brief Narrative: Greg Cook is a 50 year old male who was admitted yesterday for sepsis-like picture, fatigue, cough and decreased appetite for the past 2-1/2 weeks. CT revealed scattered opacities of the left lung, a hepatic lesion and asymmetric thickening of the left colon wall.  Assessment & Plan: Principal Problem:   Liver abscess - by CT Active Problems:   Chills   Fatigue   Pulmonary infiltrates   Cough   Weight loss   Colonic mass   Hepatic abscess   Lung nodules   Smoker   Screen for STD (sexually transmitted disease)   Tobacco abuse   Benign neoplasm of cecum   Benign neoplasm of ascending colon   Diverticulosis of colon without hemorrhage   Sepsis due to liver abscess - Patient febrile, tachycardic and tachypneic on admission with CT scan concerning for liver abscess - Interventional Radiology aspirated  - Patient reports no abdominal pain or tenderness. - Currently being treated with ceftriaxone - Blood cultures positive for gram-positive, 1 out of 2 bottles, coag negative staph, likely contaminant. Antibiotics per ID - Abscess speciating microaerophilic strep, Narrowed to ceftriaxone per infectious disease for 6 weeks   Colonic wall thickening - Neoplastic process vs diverticulitis - GI to scope this morning  Pulmonary infiltrates with cough - Scattered opacities of the left lung concerning for infection, but the opacity in the lingula raised concern for neoplasm. - Denies sputum production but endorses a "dry" cough for the past week. - Antibiotics should cover PNA   Elevated LFTs - related to the liver lesion  Thrombocytopenia - unclear etiology currently, may be related to sepsis vs alcohol abuse.  - no mentions of liver disease on imaging  Oral thrush - Noted on the buccal mucosa and  oropharynx. - Patient reports no pain or discomfort - continue nystatin  Alcohol abuse - Patient's mother stated that patient drinks heavily in the weekends - Patient reports drinking 6-12 beers on weekend nights and 1-2 some mornings when he gets off of work. - Unclear as to the accuracy of his drinking claims  - not triggering  Elevated INR - FFP per IR during procedure - Awaiting labs for reevaluation.  Tobacco abuse   DVT prophylaxis: Lovenox   Code Status: Full Family Communication: no family bedside Disposition Plan: Home when ready Barriers for discharge: colonoscopy  Consultants:   ID  IR  Procedures: None  Antimicrobials: Ceftriaxone  2/23 >>  Metronidazole  2/23 >> 2/27 Vancomycin  2/23 >> 2/27  Subjective: - no complaints, denies abdominal pain, no nausea/vomiting.  - awaiting colonoscopy   Objective: Filed Vitals:   10/13/15 1155 10/13/15 1200 10/13/15 1205 10/13/15 1225  BP: 112/83   128/87  Pulse: 104 102 102 103  Temp:    98.2 F (36.8 C)  TempSrc:    Oral  Resp: 32 35 32 20  Height:      Weight:      SpO2: 96% 96% 96% 97%    Intake/Output Summary (Last 24 hours) at 10/13/15 1300 Last data filed at 10/13/15 1200  Gross per 24 hour  Intake   1110 ml  Output  822.5 ml  Net  287.5 ml   Filed Weights   10/08/15 1022  Weight: 83.462 kg (184 lb)   Examination: BP 128/87 mmHg  Pulse 103  Temp(Src) 98.2  F (36.8 C) (Oral)  Resp 20  Ht 5\' 8"  (1.727 m)  Wt 83.462 kg (184 lb)  BMI 27.98 kg/m2  SpO2 97% General exam: NAD Respiratory system: Clear. No increased work of breathing. No wheezing, no crackles Cardiovascular system: regular rate and rhythm, no murmurs, gallops. No JVD. No peripheral edema.  Gastrointestinal system: Abdomen is nondistended, soft and nontender. Normal bowel sounds heard. Drain in place. Central nervous system: AxOx3. No focal deficits Extremities: No clubbing/cyanosis Skin: no rashes  Data Reviewed: I have  personally reviewed following labs and imaging studies  CBC:  Recent Labs Lab 10/08/15 0929 10/09/15 0515 10/10/15 0618 10/11/15 0633  WBC 8.1 9.2 7.2 5.5  NEUTROABS 5.8 8.1*  --   --   HGB 13.6 12.7* 11.2* 11.8*  HCT 38.1* 37.2* 33.1* 34.3*  MCV 97.4 98.4 97.9 98.3  PLT 102* 92* 108* [id number]*   Basic Metabolic Panel:  Recent Labs Lab 10/08/15 0929 10/09/15 0515 10/10/15 0618 10/11/15 0633  NA 128* 134* 133* 133*  K 2.7* 3.8 3.3* 3.3*  CL 92* 104 104 104  CO2 24 19* 21* 21*  GLUCOSE 161* 99 122* 99  BUN 12 5* <5* <5*  CREATININE 0.75 0.66 0.55* 0.53*  CALCIUM 7.7* 7.7* 7.7* 7.7*   GFR: Estimated Creatinine Clearance: 118.8 mL/min (by C-G formula based on Cr of 0.53). Liver Function Tests:  Recent Labs Lab 10/08/15 0929 10/09/15 0515 10/10/15 0618  AST 55* 53* 42*  ALT 33 30 24  ALKPHOS 140* 137* 113  BILITOT 2.2* 2.4* 1.6*  PROT 7.0 6.4* 5.5*  ALBUMIN 2.2* 1.8* 1.6*   Coagulation Profile:  Recent Labs Lab 10/09/15 1024 10/10/15 0618  INR 1.73* 1.59*   CBG:  Recent Labs Lab 10/08/15 0933  GLUCAP 130*   Urine analysis:    Component Value Date/Time   COLORURINE YELLOW 10/08/2015 1730   APPEARANCEUR CLEAR 10/08/2015 1730   LABSPEC 1.015 10/08/2015 1730   PHURINE 7.0 10/08/2015 1730   GLUCOSEU NEGATIVE 10/08/2015 1730   HGBUR NEGATIVE 10/08/2015 1730   BILIRUBINUR NEGATIVE 10/08/2015 1730   KETONESUR NEGATIVE 10/08/2015 1730   PROTEINUR NEGATIVE 10/08/2015 1730   NITRITE NEGATIVE 10/08/2015 1730   LEUKOCYTESUR NEGATIVE 10/08/2015 1730   Sepsis Labs: Invalid input(s): PROCALCITONIN, LACTICIDVEN  Recent Results (from the past 240 hour(s))  Blood Culture (routine x 2)     Status: None   Collection Time: 10/08/15 10:00 AM  Result Value Ref Range Status   Specimen Description BLOOD LEFT AC  Final   Special Requests BOTTLES DRAWN AEROBIC AND ANAEROBIC 5CC  Final   Culture  Setup Time   Final    GRAM POSITIVE COCCI IN CLUSTERS IN BOTH AEROBIC  AND ANAEROBIC BOTTLES CRITICAL RESULT CALLED TO, READ BACK BY AND VERIFIED WITH: K BRUMAGIN 10/09/15 @ 29 M VESTAL    Culture   Final    STAPHYLOCOCCUS SPECIES (COAGULASE NEGATIVE) THE SIGNIFICANCE OF ISOLATING THIS ORGANISM FROM A SINGLE SET OF BLOOD CULTURES WHEN MULTIPLE SETS ARE DRAWN IS UNCERTAIN. PLEASE NOTIFY THE MICROBIOLOGY DEPARTMENT WITHIN ONE WEEK IF SPECIATION AND SENSITIVITIES ARE REQUIRED. Performed at [facility]    Report Status 10/11/2015 FINAL  Final  Blood Culture (routine x 2)     Status: None (Preliminary result)   Collection Time: 10/08/15 10:20 AM  Result Value Ref Range Status   Specimen Description BLOOD LEFT HAND  Final   Special Requests BOTTLES DRAWN AEROBIC AND ANAEROBIC 5CC  Final   Culture   Final  NO GROWTH 4 DAYS Performed at [facility]    Report Status PENDING  Incomplete  Urine culture     Status: None   Collection Time: 10/08/15  5:30 PM  Result Value Ref Range Status   Specimen Description URINE, CLEAN CATCH  Final   Special Requests NONE  Final   Culture   Final    NO GROWTH 2 DAYS Performed at [facility]    Report Status 10/10/2015 FINAL  Final  Culture, routine-abscess     Status: None   Collection Time: 10/09/15  4:58 PM  Result Value Ref Range Status   Specimen Description ABSCESS  Final   Special Requests LIVER  Final   Gram Stain   Final    ABUNDANT WBC PRESENT,BOTH PMN AND MONONUCLEAR FEW SQUAMOUS EPITHELIAL CELLS PRESENT MODERATE GRAM POSITIVE COCCI IN CHAINS Performed at [facility]    Culture   Final    MODERATE MICROAEROPHILIC STREPTOCOCCI Note: Standardized susceptibility testing for this organism is not available. Performed at [facility]    Report Status 10/12/2015 FINAL  Final    Radiology Studies: No results found.   Scheduled Meds: . sodium chloride   Intravenous Once  . cefTRIAXone (ROCEPHIN)  IV  2 g Intravenous Q24H  . feeding supplement (ENSURE ENLIVE)  237  mL Oral BID BM  . folic acid  1 mg Oral Daily  . multivitamin with minerals  1 tablet Oral Daily  . nystatin  5 mL Oral QID  . [START ON 10/14/2015] pneumococcal 23 valent vaccine  0.5 mL Intramuscular Tomorrow-1000  . thiamine  100 mg Oral Daily   Continuous Infusions:     Marzetta Board, MD, PhD [facility] Pager [phone]  If 7PM-7AM, please contact night-coverage [email or web address] Password [email or web address] 10/13/2015, 1:00 PM

## 2015-10-13 NOTE — Progress Notes (Signed)
    Referring Physician(s): TRH  Supervising Physician: Markus Daft  Chief Complaint:  Hepatic abscess drain placed 2/24 Output slow to minimal Cx +strept  Subjective:  Pt feeling better daily Eating pizza today! No pain No N/V Drain in place  Allergies: Review of patient's allergies indicates no known allergies.  Medications: Prior to Admission medications   Medication Sig Start Date End Date Taking? Authorizing Provider  Aspirin-Acetaminophen-Caffeine (GOODYS EXTRA STRENGTH) [phone] MG PACK Take 1 packet by mouth 2 (two) times daily as needed. For pain   Yes Historical Provider, MD  ibuprofen (ADVIL,MOTRIN) 200 MG tablet Take 400 mg by mouth every 6 (six) hours as needed.   Yes Historical Provider, MD     Vital Signs: BP 128/87 mmHg  Pulse 103  Temp(Src) 98.2 F (36.8 C) (Oral)  Resp 20  Ht 5\' 8"  (1.727 m)  Wt 184 lb (83.462 kg)  BMI 27.98 kg/m2  SpO2 97%  Physical Exam  Abdominal: Soft. Bowel sounds are normal.  Skin: Skin is warm and dry.  Drain intact Skin site clean and dry Output serous Minimal 13 cc yesterday +strept afeb Wbc wnl   Nursing note and vitals reviewed.   Imaging: No results found.  Labs:  CBC:  Recent Labs  10/08/15 0929 10/09/15 0515 10/10/15 0618 10/11/15 0633  WBC 8.1 9.2 7.2 5.5  HGB 13.6 12.7* 11.2* 11.8*  HCT 38.1* 37.2* 33.1* 34.3*  PLT 102* 92* 108* 113*    COAGS:  Recent Labs  10/09/15 1024 10/10/15 0618  INR 1.73* 1.59*    BMP:  Recent Labs  10/08/15 0929 10/09/15 0515 10/10/15 0618 10/11/15 0633  NA 128* 134* 133* 133*  K 2.7* 3.8 3.3* 3.3*  CL 92* 104 104 104  CO2 24 19* 21* 21*  GLUCOSE 161* 99 122* 99  BUN 12 5* <5* <5*  CALCIUM 7.7* 7.7* 7.7* 7.7*  CREATININE 0.75 0.66 0.55* 0.53*  GFRNONAA >60 >60 >60 >60  GFRAA >60 >60 >60 >60    LIVER FUNCTION TESTS:  Recent Labs  10/08/15 0929 10/09/15 0515 10/10/15 0618  BILITOT 2.2* 2.4* 1.6*  AST 55* 53* 42*  ALT 33 30 24    ALKPHOS 140* 137* 113  PROT 7.0 6.4* 5.5*  ALBUMIN 2.2* 1.8* 1.6*    Assessment and Plan:  Hepatic abscess Drain intact Will follow Will need follow up in IR OP drain clinic Order in for scheduler to contact him  Electronically Signed: Marquize Seib A 10/13/2015, 2:09 PM   I spent a total of 15 Minutes at the the patient's bedside AND on the patient's hospital floor or unit, greater than 50% of which was counseling/coordinating care for hepatic abscess

## 2015-10-13 NOTE — H&P (View-Only) (Signed)
[facility] Consult: 11:22 AM 10/12/2015  LOS: 4 days    Referring Provider: Dr Cruzita Lederer.   Primary Care Physician:  No primary care provider on file. Primary Gastroenterologist:  Althia Forts.      Reason for Consultation:  Abnormal left colon.    HPI: Greg Cook is a 50 y.o. male.  S/p ex lap for SBO ~1983 (age 29).  2007 hx of traumatic right femur fracture, s/p ORIF. Previous ACL surgery.    Pt generally healthy.  On Feb 5th began with malaise.  A couple of days later, had onset chills, non-bloody diarrhea.  Diarrhea resolved but progressive fatigue, malaise and ongoing chills.  Never had abd pain, n/v.    Presented to ED 10/08/15.  CT chest, abd, pelvis 2/23:  Multiple low-density structures along the posterior medial aspect of the right hepatic lobe. In addition, there is low-density in the surrounding right hepatic lobe. Difficult to measure this process but it roughly measures 5.9 x 6.6 x 8.1 cm. Normal appearance the left hepatic lobe. Main portal venous system is patent. Normal appearance of the gallbladder. Pancreas: Normal appearance of pancreas without inflammation or duct dilatation. Spleen: Normal appearance of spleen without enlargement Extensive diverticulosis involving the left colon. Asymmetric wall thickening involving the proximal sigmoid colon on sequence 2, image 97. In addition, there is focal wall thickening in the left colon at the junction of the sigmoid colon and descending colon on sequence 2, image 89 that measures up to 2.0 cm. No significant pericolonic inflammation. No acute abnormality to the stomach or small bowel IMPRESSION: There are scattered opacities in the left lung, largest in the left upper lobe. Based on the distribution of disease, favor an infectious  etiology. However, the rounded opacity in the lingula also raises concern for a neoplastic process. Recommend follow-up after antibiotic therapy to ensure resolution of these lung findings. Large complex lesion involving the right hepatic lobe. This lesion is multiloculated with low-density or cystic areas. Findings are concerning for a complex hepatic abscess. A neoplastic process cannot be excluded. The extent of this liver lesion could be better characterized with MRI, with and without contrast. Areas of asymmetric wall thickening involving the left colon at the junction of the descending colon and sigmoid colon. Findings could represent areas of diverticulitis but there is no significant pericolonic inflammation. Colonic neoplasms are also in the differential diagnosis. 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 There is also mild dilatation to the distal right ureter. The right hydronephrosis could be chronic because there is no significant inflammatory changes in this area. However, a distal right ureter lesion cannot be excluded.   S/p perc drainage of pus from liver abscess.  Thus far clx of blood, urine, liver pus are all negative.  HIV and Hep C Ab negative.  WBCs normal, T max 100.7.  T bili 2.2,  AST/ALT 55/33, alk phos 140.  Albumin 1.6.  Some tachycardia into 1teens  Pt denies any GI issues in recent months.    Past Medical History  Diagnosis  Date  . Thrombocytopenia ([place]) 09/2015  . Liver abscess 09/2015    S/P PERC DRAINAGE.   Marland Kitchen Hydronephrosis 09/2015.     RIGHT  . Diverticulosis of colon 09/2015    LEFT COLON    Past Surgical History  Procedure Laterality Date  . Exploratory laparotomy  [phone]    for SBO at age 89  . Orif femur fracture  2007  . Anterior cruciate ligament repair  BEFORE 2007  . Dental extractions      edentulous, wears full set dentures.     Prior to Admission medications   Medication Sig Start Date End Date Taking?  Authorizing Provider  Aspirin-Acetaminophen-Caffeine (GOODYS EXTRA STRENGTH) [phone] MG PACK Take 1 packet by mouth 2 (two) times daily as needed. For pain   Yes Historical Provider, MD  ibuprofen (ADVIL,MOTRIN) 200 MG tablet Take 400 mg by mouth every 6 (six) hours as needed.   Yes Historical Provider, MD    Scheduled Meds: . sodium chloride   Intravenous Once  . cefTRIAXone (ROCEPHIN)  IV  2 g Intravenous Q24H  . enoxaparin (LOVENOX) injection  40 mg Subcutaneous Q24H  . feeding supplement (ENSURE ENLIVE)  237 mL Oral BID BM  . folic acid  1 mg Oral Daily  . metronidazole  500 mg Intravenous 3 times per day  . multivitamin with minerals  1 tablet Oral Daily  . nystatin  5 mL Oral QID  . thiamine  100 mg Oral Daily  . vancomycin  1,000 mg Intravenous Q8H   Infusions:   PRN Meds: acetaminophen **OR** acetaminophen, LORazepam **OR** LORazepam, ondansetron **OR** ondansetron (ZOFRAN) IV, sodium chloride flush, traMADol   Allergies as of 10/08/2015  . (No Known Allergies)    History reviewed. No pertinent family history.  Denies colon or stomach disease.   Social History   Social History  . Marital Status: Single    Spouse Name: N/A  . Number of Children: N/A  . Years of Education: N/A   Occupational History  . Not on file.   Social History Main Topics  . Smoking status: Current Every Day Smoker  . Smokeless tobacco: Not on file  . Alcohol Use: Not on file  . Drug Use: Not on file  . Sexual Activity: Not on file   Other Topics Concern  . Not on file   Social History Narrative  Lives with his 41 year old son Works as a Furniture conservator/restorer Most ETOH consumed: 12 pack in 24 hours.  Not a daily etoh consumer.   REVIEW OF SYSTEMS: Constitutional:  Stable weight.  + fatigue: improved ENT:  No nose bleeds Pulm:  No cough or dyspnea.  Smokes 1PPD CV:  No palpitations, no LE edema.  GU:  No hematuria, no frequency GI:  Per HPI Heme:  No issues with escessive bleeding or  bruising   Transfusions:  None ever.  Neuro:  No headaches, no peripheral tingling or numbness Derm:  No itching, no rash or sores.  Endocrine:  No sweats or chills.  No polyuria or dysuria Immunization:  No flu shot/pneumovax documented.  Travel:  None beyond local counties in last few months.    PHYSICAL EXAM: Vital signs in last 24 hours: Filed Vitals:   10/12/15 0038 10/12/15 0521  BP: 130/91 118/87  Pulse: 106 110  Temp: 97.9 F (36.6 C) 99.4 F (37.4 C)  Resp: 18 20   Wt Readings from Last 3 Encounters:  10/08/15 83.462 kg (184 lb)    General: pleasant,  looks well.  Head:  No asymmetry or swelling  Eyes:  No icterus or pallor Ears:  Not HOH  Nose:  No discharge or congestion Mouth:  Clear, moist, full dentures in place, not removed for exam Neck:  No mass, no TMG, no JVD Lungs:  Clear bil.  No cough or labored resps Heart: RRR.  No mrg, S1/S2 audible Abdomen:  Soft, NT, ND.  Active BS.  No HSM or masses, no bruits.   Rectal: deferred.    Musc/Skeltl: no joint erythema or swelling.  Extremities:  No CCE  Neurologic:  No tremor or limb weakness.  Fully alert and oriented Skin:  No rash, sores.  No telangectasia Tattoos:  None see Nodes:  No cervical adenopathy   Psych:  Pleasant, cooperative, calm.   Intake/Output from previous day: 02/26 0701 - 02/27 0700 In: 1310 [IV Piggyback:1300] Out: 3704 [Urine:3700; Drains:4] Intake/Output this shift: Total I/O In: 110 [I.V.:110] Out: -   LAB RESULTS:  Recent Labs  10/10/15 0618 10/11/15 0633  WBC 7.2 5.5  HGB 11.2* 11.8*  HCT 33.1* 34.3*  PLT 108* 113*   BMET Lab Results  Component Value Date   NA 133* 10/11/2015   NA 133* 10/10/2015   NA 134* 10/09/2015   K 3.3* 10/11/2015   K 3.3* 10/10/2015   K 3.8 10/09/2015   CL 104 10/11/2015   CL 104 10/10/2015   CL 104 10/09/2015   CO2 21* 10/11/2015   CO2 21* 10/10/2015   CO2 19* 10/09/2015   GLUCOSE 99 10/11/2015   GLUCOSE 122* 10/10/2015   GLUCOSE  99 10/09/2015   BUN <5* 10/11/2015   BUN <5* 10/10/2015   BUN 5* 10/09/2015   CREATININE 0.53* 10/11/2015   CREATININE 0.55* 10/10/2015   CREATININE 0.66 10/09/2015   CALCIUM 7.7* 10/11/2015   CALCIUM 7.7* 10/10/2015   CALCIUM 7.7* 10/09/2015   LFT  Recent Labs  10/10/15 0618  PROT 5.5*  ALBUMIN 1.6*  AST 42*  ALT 24  ALKPHOS 113  BILITOT 1.6*   PT/INR Lab Results  Component Value Date   INR 1.59* 10/10/2015   INR 1.73* 10/09/2015   Hepatitis Panel No results for input(s): HEPBSAG, HCVAB, HEPAIGM, HEPBIGM in the last 72 hours. C-Diff No components found for: CDIFF Lipase  No results found for: LIPASE  Drugs of Abuse  No results found for: LABOPIA, COCAINSCRNUR, LABBENZ, AMPHETMU, THCU, LABBARB   RADIOLOGY STUDIES: No results found.  ENDOSCOPIC STUDIES: None ever  IMPRESSION:   *  Left colon thickening at descending/sigmoid junction.  Nothing in pt's story to suggest current or previous diverticulitis.   *  Liver abscess.  S/p 2/24 perc drain.   *  Remote ex lap for SBO, sounds like he had LOA but no bowel resection.   *  Lung opacities, lingular opacity concerning for neoplasm.   *  Right hydronephrosis, bifid right renal collecting system, dilated distal ureter..   *  Hyponatremia.  Hypokalemia.   *  Thrombocytopenia.   *  Hypoalbuminemia and hypocalcemia.     PLAN:     *  Per Dr Henrene Pastor. Colonoscopy at 10:30 AM 2/28. Prep ordered.   I discontinued next dose of Lovenox, can restart after colonosopy.    Azucena Freed  10/12/2015, 11:22 AM Pager: [phone]  GI ATTENDING  History, laboratories, x-rays reviewed. Patient personally seen and examined. Mother in room. Agree with comprehensive consultation note as outlined above. Patient is being treated for symptomatic liver abscess. CT imaging suggested asymmetric  thickening of the left colon. This needs evaluated, to rule out neoplasia. Plan colonoscopy tomorrow.The nature of the procedure, as well  as the risks, benefits, and alternatives were carefully and thoroughly reviewed with the patient. Ample time for discussion and questions allowed. The patient understood, was satisfied, and agreed to proceed.  Docia Chuck. Geri Seminole., M.D. [facility] Division of Gastroenterology

## 2015-10-13 NOTE — Op Note (Signed)
[facility] [place], [id number]   COLONOSCOPY PROCEDURE REPORT  PATIENT: Greg Cook, Greg Cook  MR#: [id number] BIRTHDATE: 12/03/1965 , 60  yrs. old GENDER: male ENDOSCOPIST: Eustace Quail, MD REFERRED [id number] [facility] PROCEDURE DATE:  10/13/2015 PROCEDURE:   Colonoscopy with snare polypectomy x 2  ASA CLASS:   Class III INDICATIONS:an abnormal CT. Patient with hepatic abscess. Thickening of left colon on CT for clarification. MEDICATIONS: Monitored anesthesia care and Per Anesthesia  DESCRIPTION OF PROCEDURE:   After the risks benefits and alternatives of the procedure were thoroughly explained, informed consent was obtained.  The digital rectal exam revealed no abnormalities of the rectum.   The [employer]ax Adult Colon [id number] endoscope was introduced through the anus and advanced to the cecum, which was identified by both the appendix and ileocecal valve. No adverse events experienced.   The quality of the prep was good.  (MoviPrep was used)  The instrument was then slowly withdrawn as the colon was fully examined. Estimated blood loss is zero unless otherwise noted in this procedure report.  COLON FINDINGS: Two polyps ranging between 3-76mm in size were found in the ascending colon and at the cecum.  A polypectomy was performed with a cold snare.  The resection was complete, the polyp tissue was completely retrieved and sent to histology.   There was moderate diverticulosis noted in the right colon and left colon. The examination was otherwise normal.  Retroflexed views revealed internal hemorrhoids. The time to cecum = 1.1 Withdrawal time = 12.4   The scope was withdrawn and the procedure completed. COMPLICATIONS: There were no immediate complications.  ENDOSCOPIC IMPRESSION: 1.   Two polyps were found in the ascending colon and at the cecum; polypectomy was performed with a cold snare 2.   Moderate diverticulosis was noted in the  right colon and left colon 3.   The examination was otherwise normal  RECOMMENDATIONS: 1.  Repeat colonoscopy in 5 years if polyp adenomatous; otherwise 10 years. We will send patient letter with results 2.  Return to the care of primary service.     GI will sign off  Discussed with patient and his mother. Report provided for their personal records  eSigned:  Eustace Quail, MD 10/13/2015 11:45 AM   cc: The Patient

## 2015-10-13 NOTE — Anesthesia Postprocedure Evaluation (Signed)
Anesthesia Post Note  Patient: JEWELL DESTA  Procedure(s) Performed: Procedure(s) (LRB): COLONOSCOPY (N/A)  Patient location during evaluation: Endoscopy Anesthesia Type: MAC Level of consciousness: awake and alert and oriented Pain management: pain level controlled Vital Signs Assessment: post-procedure vital signs reviewed and stable Respiratory status: spontaneous breathing, nonlabored ventilation and respiratory function stable Cardiovascular status: blood pressure returned to baseline and stable Postop Assessment: no signs of nausea or vomiting Anesthetic complications: no    Last Vitals:  Filed Vitals:   10/13/15 1200 10/13/15 1205  BP:    Pulse: 102 102  Temp:    Resp: 35 32    Last Pain:  Filed Vitals:   10/13/15 1219  PainSc: 0-No pain                 Margueritte Guthridge A.

## 2015-10-13 NOTE — Transfer of Care (Signed)
Immediate Anesthesia Transfer of Care Note  Patient: Greg Cook  Procedure(s) Performed: Procedure(s): COLONOSCOPY (N/A)  Patient Location: Endoscopy Unit  Anesthesia Type:MAC  Level of Consciousness: awake, alert , oriented and patient cooperative  Airway & Oxygen Therapy: Patient Spontanous Breathing and Patient connected to face mask oxygen  Post-op Assessment: Report given to RN and Post -op Vital signs reviewed and stable  Post vital signs: Reviewed and stable  Last Vitals:  Filed Vitals:   10/13/15 1036 10/13/15 1137  BP: 134/97   Pulse: 121 108  Temp: 36.7 C   Resp: 24 38    Complications: No apparent anesthesia complications

## 2015-10-13 NOTE — Interval H&P Note (Signed)
History and Physical Interval Note:  10/13/2015 10:30 AM  Greg Cook  has presented today for surgery, with the diagnosis of abnormal CT scan of colon  The various methods of treatment have been discussed with the patient and family. After consideration of risks, benefits and other options for treatment, the patient has consented to  Procedure(s): COLONOSCOPY (N/A) as a surgical intervention .  The patient's history has been reviewed, patient examined, no change in status, stable for surgery.  I have reviewed the patient's chart and labs.  Questions were answered to the patient's satisfaction.     [name]

## 2015-10-14 ENCOUNTER — Encounter: Payer: Self-pay | Admitting: Internal Medicine

## 2015-10-14 ENCOUNTER — Other Ambulatory Visit: Payer: Self-pay | Admitting: Internal Medicine

## 2015-10-14 ENCOUNTER — Encounter (HOSPITAL_COMMUNITY): Payer: Self-pay | Admitting: Internal Medicine

## 2015-10-14 DIAGNOSIS — K750 Abscess of liver: Secondary | ICD-10-CM

## 2015-10-14 MED ORDER — DEXTROSE 5 % IV SOLN: 2.0000 g | INTRAVENOUS | Status: DC

## 2015-10-14 MED ORDER — SODIUM CHLORIDE 0.9% FLUSH: 5.0000 mL | Freq: Every day | INTRAVENOUS | Status: AC

## 2015-10-14 MED ORDER — TRAMADOL HCL 50 MG PO TABS: 50.0000 mg | ORAL_TABLET | Freq: Four times a day (QID) | ORAL | Status: DC | PRN

## 2015-10-14 MED ORDER — HEPARIN SOD (PORK) LOCK FLUSH 100 UNIT/ML IV SOLN
250.0000 [IU] | INTRAVENOUS | Status: DC | PRN
  Administered 2015-10-14: 250 [IU]
  Filled 2015-10-14: qty 3

## 2015-10-14 MED ORDER — HEPARIN SOD (PORK) LOCK FLUSH 100 UNIT/ML IV SOLN
250.0000 [IU] | Freq: Every day | INTRAVENOUS | Status: DC
  Filled 2015-10-14: qty 2.5

## 2015-10-14 NOTE — Discharge Summary (Signed)
Physician Discharge Summary  Greg Cook [id number] DOB: July 08, 1966 DOA: 10/08/2015  PCP: No primary care provider on file.  Admit date: 10/08/2015 Discharge date: 10/14/2015  Time spent: > 40 minutes  Recommendations for Outpatient Follow-up:  1. Follow up with IR for drain care 2. Follow up with ID in 4 weeks 3. IV ceftriaxone for 6 weeks. Home health has been ordered on discharge   Discharge Diagnoses:  Principal Problem:   Liver abscess - by CT Active Problems:   Chills   Fatigue   Pulmonary infiltrates   Cough   Weight loss   Colonic mass   Hepatic abscess   Lung nodules   Smoker   Screen for STD (sexually transmitted disease)   Tobacco abuse   Benign neoplasm of cecum   Benign neoplasm of ascending colon   Diverticulosis of colon without hemorrhage  Discharge Condition: stable  Diet recommendation: regular  Filed Weights   10/08/15 1022  Weight: 83.462 kg (184 lb)    History of present illness:  See H&P, Labs, Consult and Test reports for all details in brief, patient is a Greg Cook is a 50 year old male who was admitted 2/23 for sepsis-like picture, fatigue, cough and decreased appetite for the past 2-1/2 weeks. CT revealed scattered opacities of the left lung, a hepatic lesion and asymmetric thickening of the left colon wall.  Hospital Course:  Sepsis due to liver abscess - Patient febrile, tachycardic and tachypneic on admission with CT scan concerning for liver abscess. Interventional radiology was consulted, and patient underwent aspiration of the liver lesion, and pus was obtained. 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 Infectious disease was consulted and have followed patient will hospitalized, recommending IV ceftriaxone for 6 weeks from the time of aspiration. A liver drain was maintained, still draining, he will be discharged home with that and have follow-up in IR clinic. Patient will need a repeat CT scan of the  abdomen in about 4 weeks per IR, and will need ID for follow-up as an outpatient which was arranged. Colonic wall thickening - gastroenterology was consulted to evaluate his colonic wall thickness 10 exclude a neoplasm versus other, he underwent a colonoscopy on 2/28 which showed 2 polyps which were removed. GI will follow up on pathology and contact patient. Pulmonary infiltrates with cough - Scattered opacities of the left lung concerning for infection, but the opacity in the lingula raised concern for neoplasm.  Denies sputum production but endorses a "dry" cough for the past week. Antibiotics should cover PNA. He will likely benefit from a CT scan of the chest along with a CT scan of the abdomen in 4 weeks. Elevated LFTs - related to the liver lesion, improving Thrombocytopenia- unclear etiology currently, may be related to sepsis vs alcohol abuse. No mentions of liver disease on imaging Oral thrush - Noted on the buccal mucosa and oropharynx, improved with nystatin Alcohol abuse - Patient's mother stated that patient drinks heavily in the weekends. He did not trigger CIWA, counseled for possible cessation  Tobacco abuse  Procedures:  Colonoscopy 2/28 ENDOSCOPIC IMPRESSION: 1. Two polyps were found in the ascending colon and at the cecum; polypectomy was performed with a cold snare 2. Moderate diverticulosis was noted in the right colon and left colon 3. The examination was otherwise normal  RECOMMENDATIONS: 1. Repeat colonoscopy in 5 years if polyp adenomatous; otherwise 10 years. We will send patient letter with results   Consultations:  IR  GI  ID  Discharge Exam: Filed Vitals:   10/13/15 1205 10/13/15 1225 10/14/15 0011 10/14/15 0507  BP:  128/87 117/85 121/85  Pulse: 102 103 100 101  Temp:  98.2 F (36.8 C) 98.9 F (37.2 C) 98.1 F (36.7 C)  TempSrc:  Oral Oral Oral  Resp: 32 20 18 16   Height:      Weight:      SpO2: 96% 97% 95% 96%   General:  NAD Cardiovascular: RRR Respiratory: CTA biL  Discharge Instructions Activity:  As tolerated   Get Medicines reviewed and adjusted: Please take all your medications with you for your next visit with your Primary MD  Please request your Primary MD to go over all hospital tests and procedure/radiological results at the follow up, please ask your Primary MD to get all Hospital records sent to his/her office.  If you experience worsening of your admission symptoms, develop shortness of breath, life threatening emergency, suicidal or homicidal thoughts you must seek medical attention immediately by calling 911 or calling your MD immediately if symptoms less severe.  You must read complete instructions/literature along with all the possible adverse reactions/side effects for all the Medicines you take and that have been prescribed to you. Take any new Medicines after you have completely understood and accpet all the possible adverse reactions/side effects.   Do not drive when taking Pain medications.   Do not take more than prescribed Pain, Sleep and Anxiety Medications  Special Instructions: If you have smoked or chewed Tobacco in the last 2 yrs please stop smoking, stop any regular Alcohol and or any Recreational drug use.  Wear Seat belts while driving.  Please note  You were cared for by a hospitalist during your hospital stay. Once you are discharged, your primary care physician will handle any further medical issues. Please note that NO REFILLS for any discharge medications will be authorized once you are discharged, as it is imperative that you return to your primary care physician (or establish a relationship with a primary care physician if you do not have one) for your aftercare needs so that they can reassess your need for medications and monitor your lab values.    Medication List    TAKE these medications        cefTRIAXone 2 g in dextrose 5 % 50 mL  Inject 2 g into the  vein daily.     GOODYS EXTRA STRENGTH 500-325-65 MG Pack  Generic drug:  Aspirin-Acetaminophen-Caffeine  Take 1 packet by mouth 2 (two) times daily as needed. For pain     ibuprofen 200 MG tablet  Commonly known as:  ADVIL,MOTRIN  Take 400 mg by mouth every 6 (six) hours as needed.     sodium chloride flush 0.9 % Soln  Commonly known as:  NS  5 mLs by Intracatheter route daily.     traMADol 50 MG tablet  Commonly known as:  ULTRAM  Take 1 tablet (50 mg total) by mouth every 6 (six) hours as needed for moderate pain or severe pain.           Follow-up Information    Follow up with [facility].   Why:  For home health RN and IV Abx.   Contact information:   [address] [place] [address] [phone]       Follow up with [facility] On 10/20/2015.   Why:  08:55 with Dr Warrick Parisian. Please bring your ID, insurance cards, and arrive 15 minutes  early. This is to establish your PCP.    Contact information:   [address] [place] [phone]      Follow up with HOSS, ART A, MD In 8 days.   Specialty:  Interventional Radiology   Why:  scheduler will call pt with time and date of follow up in OP drain clinic   Contact information:   [address] [place] [address] [phone]       Follow up with Alcide Evener, MD. Schedule an appointment as soon as possible for a visit on 11/11/2015.   Specialty:  Infectious Diseases   Why:  Appointment with Dr. Tommy Medal is on 11/11/15 at 3:30pm   Contact information:   [address] [place] [address] [phone]       The results of significant diagnostics from this hospitalization (including imaging, microbiology, ancillary and laboratory) are listed below for reference.    Significant Diagnostic Studies: Dg Chest 2 View  10/08/2015  CLINICAL DATA:  Cough and congestion since 09/16/2015, vomiting  and diarrhea 3 weeks ago but none since, fatigue, tired beginning, ongoing cough, decreased appetite, smoker EXAM: CHEST  2 VIEW COMPARISON:  03/05/2006 FINDINGS: Normal heart size, mediastinal contours and pulmonary vascularity. Central peribronchial thickening. Opacity in LEFT upper lobe question mass versus infiltrate. Linear subsegmental atelectasis RIGHT base. Remaining lungs clear. No pleural effusion or pneumothorax. Bones unremarkable. IMPRESSION: LEFT upper lobe opacity question mass versus focal infiltrate, malignancy not excluded; CT chest with contrast recommended to exclude tumor. Electronically Signed   By: Lavonia Dana M.D.   On: 10/08/2015 09:51   Ct Chest W Contrast  10/08/2015  CLINICAL DATA:  50 year old with cough and congestion. Fatigue and decreased appetite. Opacity in left upper lobe on recent chest radiograph. EXAM: CT CHEST, ABDOMEN, AND PELVIS WITH CONTRAST TECHNIQUE: Multidetector CT imaging of the chest, abdomen and pelvis was performed following the standard protocol during bolus administration of intravenous contrast. CONTRAST:  47mL OMNIPAQUE IOHEXOL 300 MG/ML SOLN, 147mL OMNIPAQUE IOHEXOL 300 MG/ML SOLN COMPARISON:  Chest radiograph 10/08/2015 FINDINGS: CT CHEST FINDINGS Mediastinum/Lymph Nodes: Left anterior descending coronary artery is heavily calcified. There is no significant pericardial fluid. Small lymph nodes throughout the mediastinum. Overall, no significant chest lymphadenopathy. No axillary lymphadenopathy. Normal caliber of the thoracic aorta. Lungs/Pleura: The trachea and mainstem bronchi are patent. There is a focal irregular opacity at the lingula measured 2.7 cm on sequence 4, image 47. Few subtle peripheral densities in left upper lobe. Focal parenchymal densities along the bronchovascular distribution in the left upper lobe. This lesion accounts for the recent chest radiograph finding. There is also a subtle pleural-based opacity in the superior segment of left  lower lobe on sequence 4, image 28. Musculoskeletal: No acute bone abnormality. No suspicious bone findings. CT ABDOMEN PELVIS FINDINGS Hepatobiliary: There are multiple low-density structures along the posterior medial aspect of the right hepatic lobe. In addition, there is low-density in the surrounding right hepatic lobe. Difficult to measure this process but it roughly measures 5.9 x 6.6 x 8.1 cm. Normal appearance the left hepatic lobe. Main portal venous system is patent. Normal appearance of the gallbladder. Pancreas: Normal appearance of pancreas without inflammation or duct dilatation. Spleen: Normal appearance of spleen without enlargement. Adrenals/Urinary Tract: Normal adrenal glands. 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 There is mild-moderate dilatation of the proximal right ureters. There is mild dilatation of the distal right ureter. No  evidence for an obstructing stone. Small fluid in the urinary bladder without gross abnormality. No acute inflammatory changes involving the kidneys. Stomach/Bowel: There is oral contrast in the small bowel and colon. There is extensive diverticulosis involving the left colon. Asymmetric wall thickening involving the proximal sigmoid colon on sequence 2, image 97. In addition, there is focal wall thickening in the left colon at the junction of the sigmoid colon and descending colon on sequence 2, image 89 that measures up to 2.0 cm. No significant pericolonic inflammation. No acute abnormality to the stomach or small bowel. Vascular/Lymphatic: Few atherosclerotic calcifications of the abdominal aorta without aneurysm. Plaque involving the external iliac arteries bilaterally. There is no significant abdominal or pelvic lymphadenopathy. Small retroperitoneal lymph nodes along the left side of the aorta. Reproductive: Calcifications in the prostate without significant enlargement. No free fluid. No free air. Small periumbilical hernia containing fat. Other:  Intramedullary nail with screw fixation in the proximal right femur. No suspicious bone findings. Musculoskeletal:  No suspicious bone lesions identified. IMPRESSION: There are scattered opacities in the left lung, largest in the left upper lobe. Based on the distribution of disease, favor an infectious etiology. However, the rounded opacity in the lingula also raises concern for a neoplastic process. Recommend follow-up after antibiotic therapy to ensure resolution of these lung findings. Large complex lesion involving the right hepatic lobe. This lesion is multiloculated with low-density or cystic areas. Findings are concerning for a complex hepatic abscess. A neoplastic process cannot be excluded. The extent of this liver lesion could be better characterized with MRI, with and without contrast. Areas of asymmetric wall thickening involving the left colon at the junction of the descending colon and sigmoid colon. Findings could represent areas of diverticulitis but there is no significant pericolonic inflammation. Colonic neoplasms are also in the differential diagnosis. 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 There is also mild dilatation to the distal right ureter. The right hydronephrosis could be chronic because there is no significant inflammatory changes in this area. However, a distal right ureter lesion cannot be excluded. These results were called by telephone at the time of interpretation on 10/08/2015 at 12:04 pm to Dr. Darl Householder , who verbally acknowledged these results. Electronically Signed   By: Markus Daft M.D.   On: 10/08/2015 12:13   Ct Abdomen Pelvis W Contrast  10/08/2015  CLINICAL DATA:  50 year old with cough and congestion. Fatigue and decreased appetite. Opacity in left upper lobe on recent chest radiograph. EXAM: CT CHEST, ABDOMEN, AND PELVIS WITH CONTRAST TECHNIQUE: Multidetector CT imaging of the chest, abdomen and pelvis was performed following the standard  protocol during bolus administration of intravenous contrast. CONTRAST:  66mL OMNIPAQUE IOHEXOL 300 MG/ML SOLN, 117mL OMNIPAQUE IOHEXOL 300 MG/ML SOLN COMPARISON:  Chest radiograph 10/08/2015 FINDINGS: CT CHEST FINDINGS Mediastinum/Lymph Nodes: Left anterior descending coronary artery is heavily calcified. There is no significant pericardial fluid. Small lymph nodes throughout the mediastinum. Overall, no significant chest lymphadenopathy. No axillary lymphadenopathy. Normal caliber of the thoracic aorta. Lungs/Pleura: The trachea and mainstem bronchi are patent. There is a focal irregular opacity at the lingula measured 2.7 cm on sequence 4, image 47. Few subtle peripheral densities in left upper lobe. Focal parenchymal densities along the bronchovascular distribution in the left upper lobe. This lesion accounts for the recent chest radiograph finding. There is also a subtle pleural-based opacity in the superior segment of left lower lobe on sequence 4, image 28. Musculoskeletal: No acute bone abnormality. No  suspicious bone findings. CT ABDOMEN PELVIS FINDINGS Hepatobiliary: There are multiple low-density structures along the posterior medial aspect of the right hepatic lobe. In addition, there is low-density in the surrounding right hepatic lobe. Difficult to measure this process but it roughly measures 5.9 x 6.6 x 8.1 cm. Normal appearance the left hepatic lobe. Main portal venous system is patent. Normal appearance of the gallbladder. Pancreas: Normal appearance of pancreas without inflammation or duct dilatation. Spleen: Normal appearance of spleen without enlargement. Adrenals/Urinary Tract: Normal adrenal glands. 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 There is mild-moderate dilatation of the proximal right ureters. There is mild dilatation of the distal right ureter. No evidence for an obstructing stone. Small fluid in the urinary bladder without gross abnormality. No acute inflammatory changes  involving the kidneys. Stomach/Bowel: There is oral contrast in the small bowel and colon. There is extensive diverticulosis involving the left colon. Asymmetric wall thickening involving the proximal sigmoid colon on sequence 2, image 97. In addition, there is focal wall thickening in the left colon at the junction of the sigmoid colon and descending colon on sequence 2, image 89 that measures up to 2.0 cm. No significant pericolonic inflammation. No acute abnormality to the stomach or small bowel. Vascular/Lymphatic: Few atherosclerotic calcifications of the abdominal aorta without aneurysm. Plaque involving the external iliac arteries bilaterally. There is no significant abdominal or pelvic lymphadenopathy. Small retroperitoneal lymph nodes along the left side of the aorta. Reproductive: Calcifications in the prostate without significant enlargement. No free fluid. No free air. Small periumbilical hernia containing fat. Other: Intramedullary nail with screw fixation in the proximal right femur. No suspicious bone findings. Musculoskeletal:  No suspicious bone lesions identified. IMPRESSION: There are scattered opacities in the left lung, largest in the left upper lobe. Based on the distribution of disease, favor an infectious etiology. However, the rounded opacity in the lingula also raises concern for a neoplastic process. Recommend follow-up after antibiotic therapy to ensure resolution of these lung findings. Large complex lesion involving the right hepatic lobe. This lesion is multiloculated with low-density or cystic areas. Findings are concerning for a complex hepatic abscess. A neoplastic process cannot be excluded. The extent of this liver lesion could be better characterized with MRI, with and without contrast. Areas of asymmetric wall thickening involving the left colon at the junction of the descending colon and sigmoid colon. Findings could represent areas of diverticulitis but there is no significant  pericolonic inflammation. Colonic neoplasms are also in the differential diagnosis. 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 There is also mild dilatation to the distal right ureter. The right hydronephrosis could be chronic because there is no significant inflammatory changes in this area. However, a distal right ureter lesion cannot be excluded. These results were called by telephone at the time of interpretation on 10/08/2015 at 12:04 pm to Dr. Darl Householder , who verbally acknowledged these results. Electronically Signed   By: Markus Daft M.D.   On: 10/08/2015 12:13    Microbiology: Recent Results (from the past 240 hour(s))  Blood Culture (routine x 2)     Status: None   Collection Time: 10/08/15 10:00 AM  Result Value Ref Range Status   Specimen Description BLOOD LEFT AC  Final   Special Requests BOTTLES DRAWN AEROBIC AND ANAEROBIC 5CC  Final   Culture  Setup Time   Final    GRAM POSITIVE COCCI IN CLUSTERS IN BOTH AEROBIC AND ANAEROBIC BOTTLES CRITICAL RESULT CALLED TO, READ  BACK BY AND VERIFIED WITH: K BRUMAGIN 10/09/15 @ 1013 M VESTAL    Culture   Final    STAPHYLOCOCCUS SPECIES (COAGULASE NEGATIVE) THE SIGNIFICANCE OF ISOLATING THIS ORGANISM FROM A SINGLE SET OF BLOOD CULTURES WHEN MULTIPLE SETS ARE DRAWN IS UNCERTAIN. PLEASE NOTIFY THE MICROBIOLOGY DEPARTMENT WITHIN ONE WEEK IF SPECIATION AND SENSITIVITIES ARE REQUIRED. Performed at [facility]    Report Status 10/11/2015 FINAL  Final  Blood Culture (routine x 2)     Status: None   Collection Time: 10/08/15 10:20 AM  Result Value Ref Range Status   Specimen Description BLOOD LEFT HAND  Final   Special Requests BOTTLES DRAWN AEROBIC AND ANAEROBIC 5CC  Final   Culture   Final    NO GROWTH 5 DAYS Performed at [facility]    Report Status 10/13/2015 FINAL  Final  Urine culture     Status: None   Collection Time: 10/08/15  5:30 PM  Result Value Ref Range Status   Specimen Description URINE,  CLEAN CATCH  Final   Special Requests NONE  Final   Culture   Final    NO GROWTH 2 DAYS Performed at [facility]    Report Status 10/10/2015 FINAL  Final  Culture, routine-abscess     Status: None   Collection Time: 10/09/15  4:58 PM  Result Value Ref Range Status   Specimen Description ABSCESS  Final   Special Requests LIVER  Final   Gram Stain   Final    ABUNDANT WBC PRESENT,BOTH PMN AND MONONUCLEAR FEW SQUAMOUS EPITHELIAL CELLS PRESENT MODERATE GRAM POSITIVE COCCI IN CHAINS Performed at [facility]    Culture   Final    MODERATE MICROAEROPHILIC STREPTOCOCCI Note: Standardized susceptibility testing for this organism is not available. Performed at [facility]    Report Status 10/12/2015 FINAL  Final     Labs: Basic Metabolic Panel:  Recent Labs Lab 10/08/15 0929 10/09/15 0515 10/10/15 0618 10/11/15 0633  NA 128* 134* 133* 133*  K 2.7* 3.8 3.3* 3.3*  CL 92* 104 104 104  CO2 24 19* 21* 21*  GLUCOSE 161* 99 122* 99  BUN 12 5* <5* <5*  CREATININE 0.75 0.66 0.55* 0.53*  CALCIUM 7.7* 7.7* 7.7* 7.7*   Liver Function Tests:  Recent Labs Lab 10/08/15 0929 10/09/15 0515 10/10/15 0618  AST 55* 53* 42*  ALT 33 30 24  ALKPHOS 140* 137* 113  BILITOT 2.2* 2.4* 1.6*  PROT 7.0 6.4* 5.5*  ALBUMIN 2.2* 1.8* 1.6*   CBC:  Recent Labs Lab 10/08/15 0929 10/09/15 0515 10/10/15 0618 10/11/15 0633  WBC 8.1 9.2 7.2 5.5  NEUTROABS 5.8 8.1*  --   --   HGB 13.6 12.7* 11.2* 11.8*  HCT 38.1* 37.2* 33.1* 34.3*  MCV 97.4 98.4 97.9 98.3  PLT 102* 92* 108* 113*   CBG:  Recent Labs Lab 10/08/15 0933  GLUCAP 130*    Signed:  GHERGHE, COSTIN  [facility] 10/14/2015, 3:20 PM

## 2015-10-14 NOTE — Progress Notes (Signed)
Greg Cook to be D/C'd to home with home health nurse per MD order.  Discussed with the patient and all questions fully answered.  VSS, Skin clean, dry and intact without evidence of skin break down, no evidence of skin tears noted. IV catheter discontinued intact. Site without signs and symptoms of complications. Dressing and pressure applied.  An After Visit Summary was printed and given to the patient. Patient received prescriptions.  D/c education completed with patient/family including follow up instructions, medication list, d/c activities limitations if indicated, with other d/c instructions as indicated by MD - patient able to verbalize understanding, all questions fully answered.   Patient instructed to return to ED, call 911, or call MD for any changes in condition.   Patient escorted via [place], and D/C home via private auto.  Morley Kos Price 10/14/2015 1:48 PM

## 2015-10-14 NOTE — Discharge Instructions (Signed)
Will need repeat CT chest, abdomen and pelvis in 3-4 weeks prior to ID appointment  Follow with Radiology, ID as instructed  Please get a complete blood count and chemistry panel checked by your Primary MD at your next visit, and again as instructed by your Primary MD. Please get your medications reviewed and adjusted by your Primary MD.  Please request your Primary MD to go over all Hospital Tests and Procedure/Radiological results at the follow up, please get all Hospital records sent to your Prim MD by signing hospital release before you go home.  If you had Pneumonia of Lung problems at the Hospital: Please get a 2 view Chest X ray done in 6-8 weeks after hospital discharge or sooner if instructed by your Primary MD.  If you have Congestive Heart Failure: Please call your Cardiologist or Primary MD anytime you have any of the following symptoms:  1) 3 pound weight gain in 24 hours or 5 pounds in 1 week  2) shortness of breath, with or without a dry hacking cough  3) swelling in the hands, feet or stomach  4) if you have to sleep on extra pillows at night in order to breathe  Follow cardiac low salt diet and 1.5 lit/day fluid restriction.  If you have diabetes Accuchecks 4 times/day, Once in AM empty stomach and then before each meal. Log in all results and show them to your primary doctor at your next visit. If any glucose reading is under 80 or above 300 call your primary MD immediately.  If you have Seizure/Convulsions/Epilepsy: Please do not drive, operate heavy machinery, participate in activities at heights or participate in high speed sports until you have seen by Primary MD or a Neurologist and advised to do so again.  If you had Gastrointestinal Bleeding: Please ask your Primary MD to check a complete blood count within one week of discharge or at your next visit. Your endoscopic/colonoscopic biopsies that are pending at the time of discharge, will also need to followed by  your Primary MD.  Get Medicines reviewed and adjusted. Please take all your medications with you for your next visit with your Primary MD  Please request your Primary MD to go over all hospital tests and procedure/radiological results at the follow up, please ask your Primary MD to get all Hospital records sent to his/her office.  If you experience worsening of your admission symptoms, develop shortness of breath, life threatening emergency, suicidal or homicidal thoughts you must seek medical attention immediately by calling 911 or calling your MD immediately  if symptoms less severe.  You must read complete instructions/literature along with all the possible adverse reactions/side effects for all the Medicines you take and that have been prescribed to you. Take any new Medicines after you have completely understood and accpet all the possible adverse reactions/side effects.   Do not drive or operate heavy machinery when taking Pain medications.   Do not take more than prescribed Pain, Sleep and Anxiety Medications  Special Instructions: If you have smoked or chewed Tobacco  in the last 2 yrs please stop smoking, stop any regular Alcohol  and or any Recreational drug use.  Wear Seat belts while driving.  Please note You were cared for by a hospitalist during your hospital stay. If you have any questions about your discharge medications or the care you received while you were in the hospital after you are discharged, you can call the unit and asked to speak with the hospitalist  on call if the hospitalist that took care of you is not available. Once you are discharged, your primary care physician will handle any further medical issues. Please note that NO REFILLS for any discharge medications will be authorized once you are discharged, as it is imperative that you return to your primary care physician (or establish a relationship with a primary care physician if you do not have one) for your  aftercare needs so that they can reassess your need for medications and monitor your lab values.  You can reach the hospitalist office at phone [phone] or fax [phone]   If you do not have a primary care physician, you can call [phone] for a physician referral.  Activity: As tolerated with Full fall precautions use walker/cane & assistance as needed  Diet: regular  Disposition Home

## 2015-10-20 ENCOUNTER — Ambulatory Visit (INDEPENDENT_AMBULATORY_CARE_PROVIDER_SITE_OTHER): Payer: Commercial Managed Care - PPO | Admitting: Family Medicine

## 2015-10-20 ENCOUNTER — Encounter: Payer: Self-pay | Admitting: Family Medicine

## 2015-10-20 VITALS — BP 132/92 | HR 113 | Temp 97.9°F | Ht 68.0 in | Wt 171.6 lb

## 2015-10-20 DIAGNOSIS — K750 Abscess of liver: Secondary | ICD-10-CM

## 2015-10-20 NOTE — Progress Notes (Signed)
BP 132/92 mmHg  Pulse 113  Temp(Src) 97.9 F (36.6 C) (Oral)  Ht 5\' 8"  (1.727 m)  Wt 171 lb 9.6 oz (77.837 kg)  BMI 26.10 kg/m2   Subjective:    Patient ID: Greg Cook, male    DOB: 02/28/1966, 50 y.o.   MRN: [id number]  HPI: Greg Cook is a 50 y.o. male presenting on 10/20/2015 for New Patient (Initial Visit) and Hospitalization Follow-up   HPI Hepatic abscess hospital follow-up Patient is coming today to establish care with our practice as a new patient. He is also coming in for hospital follow-up. He was diagnosed with hepatic abscess and is currently being treated for that with a PICC line and IV medications. He is also taking Ultram for pain intermittently. He says he mainly uses at night so he can sleep. He then uses Advil or aspirin during the daytime.he had been ill for a month prior to going to the hospital with general malaise and decreased energy and fatigue and then went into the hospital on 10/08/2015 and was found to have the hepatic abscess. It was biopsied during the hospital visit and was consistent with an abscess and the culture came back with Streptococcus that was susceptible to any type of antibiotic.during his hospital stay he also had a colonoscopy to see if there is any possible source of where the infection could've come from. They did not necessarily find a source for where the bacteria to form the abscess could've come from. He was discharged from the hospital on 10/14/2015. He is still following up with the surgeon who is monitoring his drain that he has and may pull it here soon. He is still having some right flank pain. Pain is currently 2 out of 10 and is much improved from what it was previously. He also came to establish as the primary care physician because he has not had regular checkups or labs in quite some time.  Relevant past medical, surgical, family and social history reviewed and updated as indicated. Interim medical history since our last visit  reviewed. Allergies and medications reviewed and updated.  Review of Systems  Constitutional: Negative for fever and chills.  HENT: Negative for ear discharge and ear pain.   Eyes: Negative for discharge and visual disturbance.  Respiratory: Negative for shortness of breath and wheezing.   Cardiovascular: Negative for chest pain and leg swelling.  Gastrointestinal: Positive for abdominal pain. Negative for diarrhea and constipation.  Genitourinary: Positive for flank pain. Negative for difficulty urinating.  Musculoskeletal: Negative for back pain and gait problem.  Skin: Negative for rash.  Neurological: Negative for dizziness, syncope, light-headedness and headaches.  All other systems reviewed and are negative.   Per HPI unless specifically indicated above  Social History   Social History  . Marital Status: Single    Spouse Name: N/A  . Number of Children: N/A  . Years of Education: N/A   Occupational History  . Not on file.   Social History Main Topics  . Smoking status: Former Smoker -- 1.00 packs/day for 20 years    Types: Cigarettes    Quit date: 09/20/2015  . Smokeless tobacco: Never Used  . Alcohol Use: Yes     Comment: on weekends  . Drug Use: No  . Sexual Activity: Not Currently   Other Topics Concern  . Not on file   Social History Narrative    Past Surgical History  Procedure Laterality Date  . Exploratory laparotomy  [phone]  for SBO at age 9  . Orif femur fracture  2007  . Anterior cruciate ligament repair  BEFORE 2007  . Dental extractions      edentulous, wears full set dentures.   . Colonoscopy N/A 10/13/2015    Procedure: COLONOSCOPY;  Surgeon: Irene Shipper, MD;  Location: [facility];  Service: Endoscopy;  Laterality: N/A;    Family History  Problem Relation Age of Onset  . Cancer Maternal Grandmother       Medication List       This list is accurate as of: 10/20/15  9:54 AM.  Always use your most recent med list.                 cefTRIAXone 2 g in dextrose 5 % 50 mL  Inject 2 g into the vein daily.     GOODYS EXTRA STRENGTH 500-325-65 MG Pack  Generic drug:  Aspirin-Acetaminophen-Caffeine  Take 1 packet by mouth 2 (two) times daily as needed. For pain     heparin NICU/SCN flush 1 UNIT/ML Soln  Inject into the vein as needed.     ibuprofen 200 MG tablet  Commonly known as:  ADVIL,MOTRIN  Take 400 mg by mouth every 6 (six) hours as needed.     sodium chloride flush 0.9 % Soln  Commonly known as:  NS  5 mLs by Intracatheter route daily.     traMADol 50 MG tablet  Commonly known as:  ULTRAM  Take 1 tablet (50 mg total) by mouth every 6 (six) hours as needed for moderate pain or severe pain.           Objective:    BP 132/92 mmHg  Pulse 113  Temp(Src) 97.9 F (36.6 C) (Oral)  Ht 5\' 8"  (1.727 m)  Wt 171 lb 9.6 oz (77.837 kg)  BMI 26.10 kg/m2  Wt Readings from Last 3 Encounters:  10/20/15 171 lb 9.6 oz (77.837 kg)  10/08/15 184 lb (83.462 kg)    Physical Exam  Constitutional: He is oriented to person, place, and time. He appears well-developed and well-nourished. No distress.  Eyes: Conjunctivae and EOM are normal. Pupils are equal, round, and reactive to light. Right eye exhibits no discharge. No scleral icterus.  Neck: Neck supple. No thyromegaly present.  Cardiovascular: Normal rate, regular rhythm, normal heart sounds and intact distal pulses.   No murmur heard. Pulmonary/Chest: Effort normal and breath sounds normal. No respiratory distress. He has no wheezes.  Abdominal: He exhibits no distension. There is tenderness (tenderness over right upper quadrant where the JP drain site is extending around to his right flank on that  side).  Musculoskeletal: Normal range of motion. He exhibits no edema.  Lymphadenopathy:    He has no cervical adenopathy.  Neurological: He is alert and oriented to person, place, and time. Coordination normal.  Skin: Skin is warm and dry. No rash noted. He is not  diaphoretic.  Patient has PICC line and JP drain both on the right side  Psychiatric: He has a normal mood and affect. His behavior is normal.  Vitals reviewed.   Results for orders placed or performed during the hospital encounter of 10/08/15  Blood Culture (routine x 2)  Result Value Ref Range   Specimen Description BLOOD LEFT AC    Special Requests BOTTLES DRAWN AEROBIC AND ANAEROBIC 5CC    Culture  Setup Time      GRAM POSITIVE COCCI IN CLUSTERS IN BOTH AEROBIC AND ANAEROBIC BOTTLES CRITICAL RESULT CALLED  TO, READ BACK BY AND VERIFIED WITH: K BRUMAGIN 10/09/15 @ 1013 M VESTAL    Culture      STAPHYLOCOCCUS SPECIES (COAGULASE NEGATIVE) THE SIGNIFICANCE OF ISOLATING THIS ORGANISM FROM A SINGLE SET OF BLOOD CULTURES WHEN MULTIPLE SETS ARE DRAWN IS UNCERTAIN. PLEASE NOTIFY THE MICROBIOLOGY DEPARTMENT WITHIN ONE WEEK IF SPECIATION AND SENSITIVITIES ARE REQUIRED. Performed at [facility]    Report Status 10/11/2015 FINAL   Blood Culture (routine x 2)  Result Value Ref Range   Specimen Description BLOOD LEFT HAND    Special Requests BOTTLES DRAWN AEROBIC AND ANAEROBIC 5CC    Culture      NO GROWTH 5 DAYS Performed at [facility]    Report Status 10/13/2015 FINAL   Urine culture  Result Value Ref Range   Specimen Description URINE, CLEAN CATCH    Special Requests NONE    Culture      NO GROWTH 2 DAYS Performed at [facility]    Report Status 10/10/2015 FINAL   Culture, routine-abscess  Result Value Ref Range   Specimen Description ABSCESS    Special Requests LIVER    Gram Stain      ABUNDANT WBC PRESENT,BOTH PMN AND MONONUCLEAR FEW SQUAMOUS EPITHELIAL CELLS PRESENT MODERATE GRAM POSITIVE COCCI IN CHAINS Performed at [facility]    Culture      MODERATE MICROAEROPHILIC STREPTOCOCCI Note: Standardized susceptibility testing for this organism is not available. Performed at [facility]    Report Status 10/12/2015 FINAL     Comprehensive metabolic panel  Result Value Ref Range   Sodium 128 (L) 135 - 145 mmol/L   Potassium 2.7 (LL) 3.5 - 5.1 mmol/L   Chloride 92 (L) 101 - 111 mmol/L   CO2 24 22 - 32 mmol/L   Glucose, Bld 161 (H) 65 - 99 mg/dL   BUN 12 6 - 20 mg/dL   Creatinine, Ser 0.75 0.61 - 1.24 mg/dL   Calcium 7.7 (L) 8.9 - 10.3 mg/dL   Total Protein 7.0 6.5 - 8.1 g/dL   Albumin 2.2 (L) 3.5 - 5.0 g/dL   AST 55 (H) 15 - 41 U/L   ALT 33 17 - 63 U/L   Alkaline Phosphatase 140 (H) 38 - 126 U/L   Total Bilirubin 2.2 (H) 0.3 - 1.2 mg/dL   GFR calc non Af Amer >60 >60 mL/min   GFR calc Af Amer >60 >60 mL/min   Anion gap 12 5 - 15  CBC with Differential  Result Value Ref Range   WBC 8.1 4.0 - 10.5 K/uL   RBC 3.91 (L) 4.22 - 5.81 MIL/uL   Hemoglobin 13.6 13.0 - 17.0 g/dL   HCT 38.1 (L) 39.0 - 52.0 %   MCV 97.4 78.0 - 100.0 fL   MCH 34.8 (H) 26.0 - 34.0 pg   MCHC 35.7 30.0 - 36.0 g/dL   RDW 14.2 11.5 - 15.5 %   Platelets 102 (L) 150 - 400 K/uL   Neutrophils Relative % 72 %   Lymphocytes Relative 15 %   Monocytes Relative 12 %   Eosinophils Relative 1 %   Basophils Relative 0 %   Neutro Abs 5.8 1.7 - 7.7 K/uL   Lymphs Abs 1.2 0.7 - 4.0 K/uL   Monocytes Absolute 1.0 0.1 - 1.0 K/uL   Eosinophils Absolute 0.1 0.0 - 0.7 K/uL   Basophils Absolute 0.0 0.0 - 0.1 K/uL   Smear Review PLATELET COUNT CONFIRMED BY SMEAR   Urinalysis, Routine  w reflex microscopic  Result Value Ref Range   Color, Urine YELLOW YELLOW   APPearance CLEAR CLEAR   Specific Gravity, Urine 1.015 1.005 - 1.030   pH 7.0 5.0 - 8.0   Glucose, UA NEGATIVE NEGATIVE mg/dL   Hgb urine dipstick NEGATIVE NEGATIVE   Bilirubin Urine NEGATIVE NEGATIVE   Ketones, ur NEGATIVE NEGATIVE mg/dL   Protein, ur NEGATIVE NEGATIVE mg/dL   Nitrite NEGATIVE NEGATIVE   Leukocytes, UA NEGATIVE NEGATIVE  HIV antibody  Result Value Ref Range   HIV Screen 4th Generation wRfx Non Reactive Non Reactive  Rapid HIV screen (HIV 1/2 Ab+Ag)  Result Value Ref  Range   HIV-1 P24 Antigen - HIV24 NON REACTIVE NON REACTIVE   HIV 1/2 Antibodies NON REACTIVE NON REACTIVE   Interpretation (HIV Ag Ab)      A non reactive test result means that HIV 1 or HIV 2 antibodies and HIV 1 p24 antigen were not detected in the specimen.  Comprehensive metabolic panel  Result Value Ref Range   Sodium 134 (L) 135 - 145 mmol/L   Potassium 3.8 3.5 - 5.1 mmol/L   Chloride 104 101 - 111 mmol/L   CO2 19 (L) 22 - 32 mmol/L   Glucose, Bld 99 65 - 99 mg/dL   BUN 5 (L) 6 - 20 mg/dL   Creatinine, Ser 0.66 0.61 - 1.24 mg/dL   Calcium 7.7 (L) 8.9 - 10.3 mg/dL   Total Protein 6.4 (L) 6.5 - 8.1 g/dL   Albumin 1.8 (L) 3.5 - 5.0 g/dL   AST 53 (H) 15 - 41 U/L   ALT 30 17 - 63 U/L   Alkaline Phosphatase 137 (H) 38 - 126 U/L   Total Bilirubin 2.4 (H) 0.3 - 1.2 mg/dL   GFR calc non Af Amer >60 >60 mL/min   GFR calc Af Amer >60 >60 mL/min   Anion gap 11 5 - 15  CBC with Differential/Platelet  Result Value Ref Range   WBC 9.2 4.0 - 10.5 K/uL   RBC 3.78 (L) 4.22 - 5.81 MIL/uL   Hemoglobin 12.7 (L) 13.0 - 17.0 g/dL   HCT 37.2 (L) 39.0 - 52.0 %   MCV 98.4 78.0 - 100.0 fL   MCH 33.6 26.0 - 34.0 pg   MCHC 34.1 30.0 - 36.0 g/dL   RDW 14.7 11.5 - 15.5 %   Platelets 92 (L) 150 - 400 K/uL   Neutrophils Relative % 88 %   Neutro Abs 8.1 (H) 1.7 - 7.7 K/uL   Lymphocytes Relative 6 %   Lymphs Abs 0.5 (L) 0.7 - 4.0 K/uL   Monocytes Relative 6 %   Monocytes Absolute 0.6 0.1 - 1.0 K/uL   Eosinophils Relative 0 %   Eosinophils Absolute 0.0 0.0 - 0.7 K/uL   Basophils Relative 0 %   Basophils Absolute 0.0 0.0 - 0.1 K/uL  Protime-INR  Result Value Ref Range   Prothrombin Time 20.3 (H) 11.6 - 15.2 seconds   INR 1.73 (H) 0.00 - 1.49  Hepatitis c antibody (reflex)  Result Value Ref Range   HCV Ab <0.1 0.0 - 0.9 s/co ratio  Sedimentation rate  Result Value Ref Range   Sed Rate 107 (H) 0 - 16 mm/hr  C-reactive protein  Result Value Ref Range   CRP 19.5 (H) <1.0 mg/dL  CBC  Result  Value Ref Range   WBC 7.2 4.0 - 10.5 K/uL   RBC 3.38 (L) 4.22 - 5.81 MIL/uL   Hemoglobin 11.2 (L)  13.0 - 17.0 g/dL   HCT 33.1 (L) 39.0 - 52.0 %   MCV 97.9 78.0 - 100.0 fL   MCH 33.1 26.0 - 34.0 pg   MCHC 33.8 30.0 - 36.0 g/dL   RDW 14.9 11.5 - 15.5 %   Platelets 108 (L) 150 - 400 K/uL  Comprehensive metabolic panel  Result Value Ref Range   Sodium 133 (L) 135 - 145 mmol/L   Potassium 3.3 (L) 3.5 - 5.1 mmol/L   Chloride 104 101 - 111 mmol/L   CO2 21 (L) 22 - 32 mmol/L   Glucose, Bld 122 (H) 65 - 99 mg/dL   BUN <5 (L) 6 - 20 mg/dL   Creatinine, Ser 0.55 (L) 0.61 - 1.24 mg/dL   Calcium 7.7 (L) 8.9 - 10.3 mg/dL   Total Protein 5.5 (L) 6.5 - 8.1 g/dL   Albumin 1.6 (L) 3.5 - 5.0 g/dL   AST 42 (H) 15 - 41 U/L   ALT 24 17 - 63 U/L   Alkaline Phosphatase 113 38 - 126 U/L   Total Bilirubin 1.6 (H) 0.3 - 1.2 mg/dL   GFR calc non Af Amer >60 >60 mL/min   GFR calc Af Amer >60 >60 mL/min   Anion gap 8 5 - 15  Protime-INR  Result Value Ref Range   Prothrombin Time 19.0 (H) 11.6 - 15.2 seconds   INR 1.59 (H) 0.00 - 1.49  AFP tumor marker  Result Value Ref Range   AFP-Tumor Marker 1.7 0.0 - 8.3 ng/mL  CEA  Result Value Ref Range   CEA 5.5 (H) 0.0 - 4.7 ng/mL  Basic metabolic panel  Result Value Ref Range   Sodium 133 (L) 135 - 145 mmol/L   Potassium 3.3 (L) 3.5 - 5.1 mmol/L   Chloride 104 101 - 111 mmol/L   CO2 21 (L) 22 - 32 mmol/L   Glucose, Bld 99 65 - 99 mg/dL   BUN <5 (L) 6 - 20 mg/dL   Creatinine, Ser 0.53 (L) 0.61 - 1.24 mg/dL   Calcium 7.7 (L) 8.9 - 10.3 mg/dL   GFR calc non Af Amer >60 >60 mL/min   GFR calc Af Amer >60 >60 mL/min   Anion gap 8 5 - 15  CBC  Result Value Ref Range   WBC 5.5 4.0 - 10.5 K/uL   RBC 3.49 (L) 4.22 - 5.81 MIL/uL   Hemoglobin 11.8 (L) 13.0 - 17.0 g/dL   HCT 34.3 (L) 39.0 - 52.0 %   MCV 98.3 78.0 - 100.0 fL   MCH 33.8 26.0 - 34.0 pg   MCHC 34.4 30.0 - 36.0 g/dL   RDW 14.8 11.5 - 15.5 %   Platelets 113 (L) 150 - 400 K/uL  HCV Comment:    Result Value Ref Range   Comment: Comment   CBG monitoring, ED  Result Value Ref Range   Glucose-Capillary 130 (H) 65 - 99 mg/dL  I-Stat CG4 Lactic Acid, ED  (not at  [facility])  Result Value Ref Range   Lactic Acid, Venous 2.95 (HH) 0.5 - 2.0 mmol/L   Comment NOTIFIED PHYSICIAN   I-Stat CG4 Lactic Acid, ED  (not at  [facility])  Result Value Ref Range   Lactic Acid, Venous 1.59 0.5 - 2.0 mmol/L  Type and screen [place]  Result Value Ref Range   ABO/RH(D) A POS    Antibody Screen NEG    Sample Expiration 10/12/2015   Prepare fresh frozen plasma  Result Value Ref  Range   Unit Number [id number]    Blood Component Type THWPLS APHR1    Unit division 00    Status of Unit ISSUED,FINAL    Transfusion Status OK TO TRANSFUSE   ABO/Rh  Result Value Ref Range   ABO/RH(D) A POS       Assessment & Plan:   Problem List Items Addressed This Visit      Digestive   Hepatic abscess - Primary    Being monitored by surgery, will have patient come back for routine checkup after the infection clears.          Follow up plan: Return in about 3 months (around 01/20/2016), or if symptoms worsen or fail to improve, for well physical.  Caryl Pina, MD [facility] 10/20/2015, 9:54 AM

## 2015-10-21 NOTE — Assessment & Plan Note (Signed)
Being monitored by surgery, will have patient come back for routine checkup after the infection clears.

## 2015-10-22 ENCOUNTER — Ambulatory Visit
Admission: RE | Admit: 2015-10-22 | Discharge: 2015-10-22 | Disposition: A | Discharge status: Home / Self Care | Payer: Commercial Managed Care - PPO | Source: Ambulatory Visit | Attending: Internal Medicine | Admitting: Internal Medicine

## 2015-10-22 ENCOUNTER — Ambulatory Visit
Admission: RE | Admit: 2015-10-22 | Discharge: 2015-10-22 | Disposition: A | Discharge status: Home / Self Care | Payer: Commercial Managed Care - PPO | Source: Ambulatory Visit | Attending: Radiology | Admitting: Radiology

## 2015-10-22 DIAGNOSIS — K750 Abscess of liver: Secondary | ICD-10-CM

## 2015-10-22 MED ORDER — IOPAMIDOL (ISOVUE-300) INJECTION 61%
100.0000 mL | Freq: Once | INTRAVENOUS | Status: AC | PRN
  Administered 2015-10-22: 100 mL via INTRAVENOUS

## 2015-10-22 NOTE — Consult Note (Signed)
Chief Complaint: Hepatic abscess post percutaneous drainage catheter placement  Referring Physician(s): Henrene Pastor (GI), Wendie Agreste (ID)  History of Present Illness: Greg Cook is a 50 y.o. male with history of diverticulosis who underwent a technically successful percutaneous hepatic abscess drainage catheter placement on 10/09/2015. Patient returns today for repeat CT scan and drainage catheter management. The patient is unaccompanied and serves as his own historian.  The patient reports minimal to no output from the hepatic percutaneous drainage catheter for the past 48-72 hours. No fever or chills. No recurrent right upper quadrant abdominal pain.  The patient continues to receive intravenous antibiotics via his right upper extremity PICC line.  Past Medical History  Diagnosis Date  . Thrombocytopenia ([place]) 09/2015  . Liver abscess 09/2015    S/P PERC DRAINAGE.   Marland Kitchen Hydronephrosis 09/2015.     RIGHT  . Diverticulosis of colon 09/2015    LEFT COLON    Past Surgical History  Procedure Laterality Date  . Exploratory laparotomy  [phone]    for SBO at age 42  . Orif femur fracture  2007  . Anterior cruciate ligament repair  BEFORE 2007  . Dental extractions      edentulous, wears full set dentures.   . Colonoscopy N/A 10/13/2015    Procedure: COLONOSCOPY;  Surgeon: Irene Shipper, MD;  Location: [place];  Service: Endoscopy;  Laterality: N/A;    Allergies: Review of patient's allergies indicates no known allergies.  Medications: Prior to Admission medications   Medication Sig Start Date End Date Taking? Authorizing Provider  Aspirin-Acetaminophen-Caffeine (GOODYS EXTRA STRENGTH) [phone] MG PACK Take 1 packet by mouth 2 (two) times daily as needed. For pain    Historical Provider, MD  cefTRIAXone 2 g in dextrose 5 % 50 mL Inject 2 g into the vein daily. 10/14/15   Costin Karlyne Greenspan, MD  Heparin Lock Flush (HEPARIN NICU/SCN FLUSH) 1 UNIT/ML SOLN Inject into the vein as needed.     Historical Provider, MD  ibuprofen (ADVIL,MOTRIN) 200 MG tablet Take 400 mg by mouth every 6 (six) hours as needed.    Historical Provider, MD  sodium chloride flush (NS) 0.9 % SOLN 5 mLs by Intracatheter route daily. 10/14/15   Costin Karlyne Greenspan, MD  traMADol (ULTRAM) 50 MG tablet Take 1 tablet (50 mg total) by mouth every 6 (six) hours as needed for moderate pain or severe pain. 10/14/15   Costin Karlyne Greenspan, MD     Family History  Problem Relation Age of Onset  . Cancer Maternal Grandmother     Social History   Social History  . Marital Status: Single    Spouse Name: N/A  . Number of Children: N/A  . Years of Education: N/A   Social History Main Topics  . Smoking status: Former Smoker -- 1.00 packs/day for 20 years    Types: Cigarettes    Quit date: 09/20/2015  . Smokeless tobacco: Never Used  . Alcohol Use: Yes     Comment: on weekends  . Drug Use: No  . Sexual Activity: Not Currently   Other Topics Concern  . Not on file   Social History Narrative    ECOG Status: 1 - Symptomatic but completely ambulatory  Review of Systems: A 12 point ROS discussed and pertinent positives are indicated in the HPI above.  All other systems are negative.  Review of Systems  Constitutional: Negative for fever, chills and fatigue.    Vital Signs: There were no vitals  taken for this visit.  Physical Exam  Abdominal:    Location of the patient's percutaneous drainage catheter.      Imaging:  CT scan of the abdomen and pelvis performed earlier today demonstrates complete resolution of the hepatic abscess.  Dg Chest 2 View  10/08/2015  CLINICAL DATA:  Cough and congestion since 09/16/2015, vomiting and diarrhea 3 weeks ago but none since, fatigue, tired beginning, ongoing cough, decreased appetite, smoker EXAM: CHEST  2 VIEW COMPARISON:  03/05/2006 FINDINGS: Normal heart size, mediastinal contours and pulmonary vascularity. Central peribronchial thickening. Opacity in LEFT upper  lobe question mass versus infiltrate. Linear subsegmental atelectasis RIGHT base. Remaining lungs clear. No pleural effusion or pneumothorax. Bones unremarkable. IMPRESSION: LEFT upper lobe opacity question mass versus focal infiltrate, malignancy not excluded; CT chest with contrast recommended to exclude tumor. Electronically Signed   By: Lavonia Dana M.D.   On: 10/08/2015 09:51   Ct Chest W Contrast  10/08/2015  CLINICAL DATA:  50 year old with cough and congestion. Fatigue and decreased appetite. Opacity in left upper lobe on recent chest radiograph. EXAM: CT CHEST, ABDOMEN, AND PELVIS WITH CONTRAST TECHNIQUE: Multidetector CT imaging of the chest, abdomen and pelvis was performed following the standard protocol during bolus administration of intravenous contrast. CONTRAST:  25mL OMNIPAQUE IOHEXOL 300 MG/ML SOLN, 182mL OMNIPAQUE IOHEXOL 300 MG/ML SOLN COMPARISON:  Chest radiograph 10/08/2015 FINDINGS: CT CHEST FINDINGS Mediastinum/Lymph Nodes: Left anterior descending coronary artery is heavily calcified. There is no significant pericardial fluid. Small lymph nodes throughout the mediastinum. Overall, no significant chest lymphadenopathy. No axillary lymphadenopathy. Normal caliber of the thoracic aorta. Lungs/Pleura: The trachea and mainstem bronchi are patent. There is a focal irregular opacity at the lingula measured 2.7 cm on sequence 4, image 47. Few subtle peripheral densities in left upper lobe. Focal parenchymal densities along the bronchovascular distribution in the left upper lobe. This lesion accounts for the recent chest radiograph finding. There is also a subtle pleural-based opacity in the superior segment of left lower lobe on sequence 4, image 28. Musculoskeletal: No acute bone abnormality. No suspicious bone findings. CT ABDOMEN PELVIS FINDINGS Hepatobiliary: There are multiple low-density structures along the posterior medial aspect of the right hepatic lobe. In addition, there is low-density  in the surrounding right hepatic lobe. Difficult to measure this process but it roughly measures 5.9 x 6.6 x 8.1 cm. Normal appearance the left hepatic lobe. Main portal venous system is patent. Normal appearance of the gallbladder. Pancreas: Normal appearance of pancreas without inflammation or duct dilatation. Spleen: Normal appearance of spleen without enlargement. Adrenals/Urinary Tract: Normal adrenal glands. 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 There is mild-moderate dilatation of the proximal right ureters. There is mild dilatation of the distal right ureter. No evidence for an obstructing stone. Small fluid in the urinary bladder without gross abnormality. No acute inflammatory changes involving the kidneys. Stomach/Bowel: There is oral contrast in the small bowel and colon. There is extensive diverticulosis involving the left colon. Asymmetric wall thickening involving the proximal sigmoid colon on sequence 2, image 97. In addition, there is focal wall thickening in the left colon at the junction of the sigmoid colon and descending colon on sequence 2, image 89 that measures up to 2.0 cm. No significant pericolonic inflammation. No acute abnormality to the stomach or small bowel. Vascular/Lymphatic: Few atherosclerotic calcifications of the abdominal aorta without aneurysm. Plaque involving the external iliac arteries bilaterally. There is no significant abdominal or pelvic lymphadenopathy. Small retroperitoneal lymph nodes along the  left side of the aorta. Reproductive: Calcifications in the prostate without significant enlargement. No free fluid. No free air. Small periumbilical hernia containing fat. Other: Intramedullary nail with screw fixation in the proximal right femur. No suspicious bone findings. Musculoskeletal:  No suspicious bone lesions identified. IMPRESSION: There are scattered opacities in the left lung, largest in the left upper lobe. Based on the distribution of disease,  favor an infectious etiology. However, the rounded opacity in the lingula also raises concern for a neoplastic process. Recommend follow-up after antibiotic therapy to ensure resolution of these lung findings. Large complex lesion involving the right hepatic lobe. This lesion is multiloculated with low-density or cystic areas. Findings are concerning for a complex hepatic abscess. A neoplastic process cannot be excluded. The extent of this liver lesion could be better characterized with MRI, with and without contrast. Areas of asymmetric wall thickening involving the left colon at the junction of the descending colon and sigmoid colon. Findings could represent areas of diverticulitis but there is no significant pericolonic inflammation. Colonic neoplasms are also in the differential diagnosis. 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 There is also mild dilatation to the distal right ureter. The right hydronephrosis could be chronic because there is no significant inflammatory changes in this area. However, a distal right ureter lesion cannot be excluded. These results were called by telephone at the time of interpretation on 10/08/2015 at 12:04 pm to Dr. Darl Householder , who verbally acknowledged these results. Electronically Signed   By: Markus Daft M.D.   On: 10/08/2015 12:13   Ct Abdomen Pelvis W Contrast  10/08/2015  CLINICAL DATA:  50 year old with cough and congestion. Fatigue and decreased appetite. Opacity in left upper lobe on recent chest radiograph. EXAM: CT CHEST, ABDOMEN, AND PELVIS WITH CONTRAST TECHNIQUE: Multidetector CT imaging of the chest, abdomen and pelvis was performed following the standard protocol during bolus administration of intravenous contrast. CONTRAST:  51mL OMNIPAQUE IOHEXOL 300 MG/ML SOLN, 160mL OMNIPAQUE IOHEXOL 300 MG/ML SOLN COMPARISON:  Chest radiograph 10/08/2015 FINDINGS: CT CHEST FINDINGS Mediastinum/Lymph Nodes: Left anterior descending coronary artery  is heavily calcified. There is no significant pericardial fluid. Small lymph nodes throughout the mediastinum. Overall, no significant chest lymphadenopathy. No axillary lymphadenopathy. Normal caliber of the thoracic aorta. Lungs/Pleura: The trachea and mainstem bronchi are patent. There is a focal irregular opacity at the lingula measured 2.7 cm on sequence 4, image 47. Few subtle peripheral densities in left upper lobe. Focal parenchymal densities along the bronchovascular distribution in the left upper lobe. This lesion accounts for the recent chest radiograph finding. There is also a subtle pleural-based opacity in the superior segment of left lower lobe on sequence 4, image 28. Musculoskeletal: No acute bone abnormality. No suspicious bone findings. CT ABDOMEN PELVIS FINDINGS Hepatobiliary: There are multiple low-density structures along the posterior medial aspect of the right hepatic lobe. In addition, there is low-density in the surrounding right hepatic lobe. Difficult to measure this process but it roughly measures 5.9 x 6.6 x 8.1 cm. Normal appearance the left hepatic lobe. Main portal venous system is patent. Normal appearance of the gallbladder. Pancreas: Normal appearance of pancreas without inflammation or duct dilatation. Spleen: Normal appearance of spleen without enlargement. Adrenals/Urinary Tract: Normal adrenal glands. 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 There is mild-moderate dilatation of the proximal right ureters. There is mild dilatation of the distal right ureter. No evidence for an obstructing stone. Small fluid in the urinary bladder without gross abnormality.  No acute inflammatory changes involving the kidneys. Stomach/Bowel: There is oral contrast in the small bowel and colon. There is extensive diverticulosis involving the left colon. Asymmetric wall thickening involving the proximal sigmoid colon on sequence 2, image 97. In addition, there is focal wall thickening in  the left colon at the junction of the sigmoid colon and descending colon on sequence 2, image 89 that measures up to 2.0 cm. No significant pericolonic inflammation. No acute abnormality to the stomach or small bowel. Vascular/Lymphatic: Few atherosclerotic calcifications of the abdominal aorta without aneurysm. Plaque involving the external iliac arteries bilaterally. There is no significant abdominal or pelvic lymphadenopathy. Small retroperitoneal lymph nodes along the left side of the aorta. Reproductive: Calcifications in the prostate without significant enlargement. No free fluid. No free air. Small periumbilical hernia containing fat. Other: Intramedullary nail with screw fixation in the proximal right femur. No suspicious bone findings. Musculoskeletal:  No suspicious bone lesions identified. IMPRESSION: There are scattered opacities in the left lung, largest in the left upper lobe. Based on the distribution of disease, favor an infectious etiology. However, the rounded opacity in the lingula also raises concern for a neoplastic process. Recommend follow-up after antibiotic therapy to ensure resolution of these lung findings. Large complex lesion involving the right hepatic lobe. This lesion is multiloculated with low-density or cystic areas. Findings are concerning for a complex hepatic abscess. A neoplastic process cannot be excluded. The extent of this liver lesion could be better characterized with MRI, with and without contrast. Areas of asymmetric wall thickening involving the left colon at the junction of the descending colon and sigmoid colon. Findings could represent areas of diverticulitis but there is no significant pericolonic inflammation. Colonic neoplasms are also in the differential diagnosis. 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 There is also mild dilatation to the distal right ureter. The right hydronephrosis could be chronic because there is no  significant inflammatory changes in this area. However, a distal right ureter lesion cannot be excluded. These results were called by telephone at the time of interpretation on 10/08/2015 at 12:04 pm to Dr. Darl Householder , who verbally acknowledged these results. Electronically Signed   By: Markus Daft M.D.   On: 10/08/2015 12:13   Ct Image Guided Drainage By Percutaneous Catheter  10/15/2015  CLINICAL DATA:  Liver abscess EXAM: CT IMAGE GUIDED DRAINAGE BY PERCUTANEOUS CATHETER FLUOROSCOPY TIME:  None MEDICATIONS AND MEDICAL HISTORY: Versed 3 mg, Fentanyl 200 mcg. Additional Medications: None. ANESTHESIA/SEDATION: Moderate sedation time: 15 minutes CONTRAST:  None PROCEDURE: The procedure, risks, benefits, and alternatives were explained to the patient. Questions regarding the procedure were encouraged and answered. The patient understands and consents to the procedure. The right flank was prepped with Betadine in a sterile fashion, and a sterile drape was applied covering the operative field. A sterile gown and sterile gloves were used for the procedure. Under CT guidance, an 18 gauge needle was inserted into the liver abscess via right intercostal approach. It was removed over an Amplatz wire. Ten [place] dilator followed by a 10 [place] drain were inserted. Frank pus was aspirated. It was looped and string fixed then sewn to the skin. FINDINGS: Imaging demonstrates placement of a 10 French abscess strain into a liver abscess. COMPLICATIONS: None IMPRESSION: Successful 10 French liver abscess drain. Electronically Signed   By: Marybelle Killings M.D.   On: 10/15/2015 08:19    Labs:  CBC:  Recent Labs  10/08/15 0929 10/09/15 0515 10/10/15 [id number]  10/11/15 0633  WBC 8.1 9.2 7.2 5.5  HGB 13.6 12.7* 11.2* 11.8*  HCT 38.1* 37.2* 33.1* 34.3*  PLT 102* 92* 108* 113*    COAGS:  Recent Labs  10/09/15 1024 10/10/15 0618  INR 1.73* 1.59*    BMP:  Recent Labs  10/08/15 0929 10/09/15 0515 10/10/15 0618  10/11/15 0633  NA 128* 134* 133* 133*  K 2.7* 3.8 3.3* 3.3*  CL 92* 104 104 104  CO2 24 19* 21* 21*  GLUCOSE 161* 99 122* 99  BUN 12 5* <5* <5*  CALCIUM 7.7* 7.7* 7.7* 7.7*  CREATININE 0.75 0.66 0.55* 0.53*  GFRNONAA >60 >60 >60 >60  GFRAA >60 >60 >60 >60    LIVER FUNCTION TESTS:  Recent Labs  10/08/15 0929 10/09/15 0515 10/10/15 0618  BILITOT 2.2* 2.4* 1.6*  AST 55* 53* 42*  ALT 33 30 24  ALKPHOS 140* 137* 113  PROT 7.0 6.4* 5.5*  ALBUMIN 2.2* 1.8* 1.6*    TUMOR MARKERS:  Recent Labs  10/10/15 1504  AFPTM 1.7  CEA 5.5*    Assessment and Plan:  Greg Cook is a 50 y.o. male with history of diverticulosis who underwent a technically successful percutaneous hepatic abscess drainage catheter placement on 10/09/2015 by my partner, Dr. Barbie Banner.  The patient reports minimal to no output from the hepatic percutaneous drainage catheter for the past 48-72 hours. No fever or chills. No recurrent right upper quadrant abdominal pain.  CT scan performed earlier today demonstrates complete resolution of the hepatic abscess.   As such, decision was made to remove the percutaneous drainage catheter.  The external portion of the percutaneous drainage catheter was cut and the drainage catheter was removed intact.  A dressing was placed. The patient tolerated the procedure well and immediate post procedural complication.  The patient was encouraged to complete his course of intravenous antibiotics and to keep all of his follow-up appointments.  A copy of this report was sent to the requesting provider on this date.  Electronically Signed: Sandi Mariscal 10/22/2015, 2:22 PM   I spent a total of 10 Minutes in face to face in clinical consultation, greater than 50% of which was counseling/coordinating care for Hepatic abscess drainage catheter.

## 2015-11-05 ENCOUNTER — Ambulatory Visit (INDEPENDENT_AMBULATORY_CARE_PROVIDER_SITE_OTHER): Payer: Commercial Managed Care - PPO | Admitting: Family Medicine

## 2015-11-05 ENCOUNTER — Encounter: Payer: Self-pay | Admitting: Family Medicine

## 2015-11-05 VITALS — BP 154/108 | HR 104 | Temp 97.9°F | Ht 68.0 in | Wt 180.0 lb

## 2015-11-05 DIAGNOSIS — K750 Abscess of liver: Secondary | ICD-10-CM

## 2015-11-05 NOTE — Progress Notes (Signed)
BP 154/108 mmHg  Pulse 104  Temp(Src) 97.9 F (36.6 C) (Oral)  Ht 5\' 8"  (1.727 m)  Wt 180 lb (81.647 kg)  BMI 27.38 kg/m2   Subjective:    Patient ID: Greg Cook, male    DOB: 18-Jul-1966, 50 y.o.   MRN: [id number]  HPI: Greg Cook is a 50 y.o. male presenting on 11/05/2015 for Edema   HPI Swelling in a spot on his abdomen Patient is coming in today because he is concerned about a spot on his abdomen where he feels like it may be a little more more swollen are bulging out in his right upper quadrant. He thinks it started a day or 2 ago. He denies any pain or erythema or warmth or decrease in appetite or nausea or vomiting or diarrhea or constipation associated with this. He also has a numbness on the skin overlying that area which may be from where they went in laterally with the drainage tube in touch to nerve on that side.  Relevant past medical, surgical, family and social history reviewed and updated as indicated. Interim medical history since our last visit reviewed. Allergies and medications reviewed and updated.  Review of Systems  Constitutional: Negative for fever, activity change, appetite change and fatigue.  HENT: Negative for congestion, ear discharge and ear pain.   Eyes: Negative for discharge and visual disturbance.  Respiratory: Negative for shortness of breath and wheezing.   Cardiovascular: Negative for chest pain and leg swelling.  Gastrointestinal: Positive for abdominal distention. Negative for nausea, vomiting, abdominal pain, diarrhea, constipation, blood in stool, anal bleeding and rectal pain.  Genitourinary: Negative for difficulty urinating.  Musculoskeletal: Negative for back pain and gait problem.  Skin: Negative for rash.  Neurological: Negative for syncope, light-headedness and headaches.  All other systems reviewed and are negative.   Per HPI unless specifically indicated above     Medication List       This list is accurate as of:  11/05/15  1:40 PM.  Always use your most recent med list.               cefTRIAXone 2 g in dextrose 5 % 50 mL  Inject 2 g into the vein daily.     GOODYS EXTRA STRENGTH 500-325-65 MG Pack  Generic drug:  Aspirin-Acetaminophen-Caffeine  Take 1 packet by mouth 2 (two) times daily as needed. For pain     heparin NICU/SCN flush 1 UNIT/ML Soln  Inject into the vein as needed.     ibuprofen 200 MG tablet  Commonly known as:  ADVIL,MOTRIN  Take 400 mg by mouth every 6 (six) hours as needed.     sodium chloride flush 0.9 % Soln  Commonly known as:  NS  5 mLs by Intracatheter route daily.     traMADol 50 MG tablet  Commonly known as:  ULTRAM  Take 1 tablet (50 mg total) by mouth every 6 (six) hours as needed for moderate pain or severe pain.           Objective:    BP 154/108 mmHg  Pulse 104  Temp(Src) 97.9 F (36.6 C) (Oral)  Ht 5\' 8"  (1.727 m)  Wt 180 lb (81.647 kg)  BMI 27.38 kg/m2  Wt Readings from Last 3 Encounters:  11/05/15 180 lb (81.647 kg)  10/20/15 171 lb 9.6 oz (77.837 kg)  10/08/15 184 lb (83.462 kg)    Physical Exam  Constitutional: He is oriented to person, place, and time.  He appears well-developed and well-nourished. No distress.  Eyes: Conjunctivae and EOM are normal. Pupils are equal, round, and reactive to light. Right eye exhibits no discharge. No scleral icterus.  Neck: Neck supple. No thyromegaly present.  Cardiovascular: Normal rate, regular rhythm, normal heart sounds and intact distal pulses.   No murmur heard. Pulmonary/Chest: Effort normal and breath sounds normal. No respiratory distress. He has no wheezes.  Abdominal: Soft. Bowel sounds are normal. He exhibits no distension and no mass. There is no tenderness. There is no rebound and no guarding.  No abdominal pain on exam, no noticeable swelling on exam, no overlying skin changes  Musculoskeletal: Normal range of motion. He exhibits no edema.  Lymphadenopathy:    He has no cervical  adenopathy.  Neurological: He is alert and oriented to person, place, and time. Coordination normal.  Skin: Skin is warm and dry. No rash noted. He is not diaphoretic.  Psychiatric: He has a normal mood and affect. His behavior is normal.  Vitals reviewed.     Assessment & Plan:   Problem List Items Addressed This Visit      Digestive   Hepatic abscess - Primary    Patient has developed a little bit swelling in his abdomen and was concerned that it could be something new. He has no signs of erythema or warmth or pain or loss of appetite or change in activity or fatigue. We will just monitor for now the spot that he thinks is swollen          Follow up plan: Return if symptoms worsen or fail to improve.  Counseling provided for all of the vaccine components No orders of the defined types were placed in this encounter.    Caryl Pina, MD [facility] 11/05/2015, 1:40 PM

## 2015-11-05 NOTE — Assessment & Plan Note (Signed)
Patient has developed a little bit swelling in his abdomen and was concerned that it could be something new. He has no signs of erythema or warmth or pain or loss of appetite or change in activity or fatigue. We will just monitor for now the spot that he thinks is swollen

## 2015-11-09 ENCOUNTER — Other Ambulatory Visit: Payer: Self-pay | Admitting: Family Medicine

## 2015-11-11 ENCOUNTER — Encounter: Payer: Self-pay | Admitting: Infectious Disease

## 2015-11-11 ENCOUNTER — Ambulatory Visit (INDEPENDENT_AMBULATORY_CARE_PROVIDER_SITE_OTHER): Payer: Commercial Managed Care - PPO | Admitting: Infectious Disease

## 2015-11-11 ENCOUNTER — Telehealth: Payer: Self-pay | Admitting: *Deleted

## 2015-11-11 VITALS — BP 175/111 | HR 108 | Temp 98.4°F | Ht 69.0 in | Wt 182.0 lb

## 2015-11-11 DIAGNOSIS — K750 Abscess of liver: Secondary | ICD-10-CM | POA: Diagnosis not present

## 2015-11-11 DIAGNOSIS — K5730 Diverticulosis of large intestine without perforation or abscess without bleeding: Secondary | ICD-10-CM | POA: Diagnosis not present

## 2015-11-11 DIAGNOSIS — R918 Other nonspecific abnormal finding of lung field: Secondary | ICD-10-CM

## 2015-11-11 NOTE — Telephone Encounter (Signed)
Verbal order per Dr. Tommy Medal given to [facility] at [place] to pull patient's picc line. Myrtis Hopping

## 2015-11-11 NOTE — Progress Notes (Signed)
Subjective:   Chief complaint: followup for liver abscess with recent swelling in his soft tissue    Patient ID: Greg Cook, male    DOB: 1966-03-14, 50 y.o.   MRN: [id number]  HPI  49 y.o. 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   He has been on high dose rocephin IV after organisms identified in the hospital and continued on this ever since. The liver abscess resolved radio graphically per Dr. Pascal Lux from IR who had his drain pulled. He has remained on IV rocephin.  After drain was pulled his home health RN and he had noticed an area of swelling and numbness in his abdomen more in the midline and in soft tissue. This is no longer present.  Past Medical History  Diagnosis Date  . Thrombocytopenia ([place]) 09/2015  . Liver abscess 09/2015    S/P PERC DRAINAGE.   Marland Kitchen Hydronephrosis 09/2015.     RIGHT  . Diverticulosis of colon 09/2015    LEFT COLON    Past Surgical History  Procedure Laterality Date  . Exploratory laparotomy  [phone]    for SBO at age 47  . Orif femur fracture  2007  . Anterior cruciate ligament repair  BEFORE 2007  . Dental extractions      edentulous, wears full set dentures.   . Colonoscopy N/A 10/13/2015    Procedure: COLONOSCOPY;  Surgeon: Irene Shipper, MD;  Location: [facility];  Service: Endoscopy;  Laterality: N/A;    Family History  Problem Relation Age of Onset  . Cancer Maternal Grandmother       Social History   Social History  . Marital Status: Single    Spouse Name: N/A  . Number of Children: N/A  . Years of Education: N/A   Social History Main Topics  . Smoking status: Current Every Day Smoker -- 0.25 packs/day for 20 years    Types: Cigarettes  . Smokeless tobacco: Never Used  . Alcohol Use: 0.0 oz/week    0 Standard drinks or equivalent per week     Comment: on weekends  . Drug Use: No  . Sexual Activity: Not Currently   Other Topics Concern  . None   Social  History Narrative    No Known Allergies   Current outpatient prescriptions:  .  ibuprofen (ADVIL,MOTRIN) 200 MG tablet, Take 400 mg by mouth every 6 (six) hours as needed., Disp: , Rfl:  .  sodium chloride flush (NS) 0.9 % SOLN, 5 mLs by Intracatheter route daily., Disp: 30 Syringe, Rfl: 2     Review of Systems  Constitutional: Negative for fever, chills, diaphoresis, activity change, appetite change, fatigue and unexpected weight change.  HENT: Negative for congestion, rhinorrhea, sinus pressure, sneezing, sore throat and trouble swallowing.   Eyes: Negative for photophobia and visual disturbance.  Respiratory: Negative for cough, chest tightness, shortness of breath, wheezing and stridor.   Cardiovascular: Negative for chest pain, palpitations and leg swelling.  Gastrointestinal: Negative for nausea, vomiting, abdominal pain, diarrhea, constipation, blood in stool, abdominal distention and anal bleeding.  Genitourinary: Negative for dysuria, hematuria, flank pain and difficulty urinating.  Musculoskeletal: Negative for myalgias, back pain, joint swelling, arthralgias and gait problem.  Skin: Negative for color change, pallor, rash and wound.  Neurological: Negative for dizziness, tremors, weakness and light-headedness.  Hematological: Negative for adenopathy. Does not bruise/bleed easily.  Psychiatric/Behavioral: Negative for behavioral problems, confusion, sleep disturbance, dysphoric mood, decreased concentration and agitation.  Objective:   Physical Exam  Constitutional: He is oriented to person, place, and time. He appears well-developed and well-nourished.  HENT:  Head: Normocephalic and atraumatic.  Eyes: Conjunctivae and EOM are normal.  Neck: Normal range of motion. Neck supple.  Cardiovascular: Normal rate and regular rhythm.   Pulmonary/Chest: Effort normal. No respiratory distress. He has no wheezes.  Abdominal: Soft. He exhibits no distension. There is no  hepatosplenomegaly. There is no tenderness. There is no rebound, no CVA tenderness and negative Murphy's sign.  Musculoskeletal: Normal range of motion. He exhibits no edema or tenderness.  Neurological: He is alert and oriented to person, place, and time.  Skin: Skin is warm and dry. No rash noted. No erythema. No pallor.  Psychiatric: He has a normal mood and affect. His behavior is normal. Judgment and thought content normal.          Assessment & Plan:   #1 Liver abscess: resolved radio graphically. DC IV abx and DC PICC  If he has recurrent ssx of pain , fever, shoulder pain he should have repeat CT scan  #2 Abdominal fullness, numbness in area of soft tissue: Not sure what to make of this. It has resolved and is not likely related to recurrence of his abscess. As above  #3 Pulmonary nodules: should have repeat CT scan  RTC in ID PRN

## 2015-11-11 NOTE — Telephone Encounter (Signed)
Excellent

## 2015-11-12 ENCOUNTER — Telehealth: Payer: Self-pay | Admitting: *Deleted

## 2015-11-12 LAB — SPECIMEN STATUS REPORT

## 2015-11-12 NOTE — Telephone Encounter (Signed)
Patient return-to-work letter faxed to work.  He did not find this out until after his [facility] visit yesterday, 11/11/15.   Address letter to Greg Cook and fax to employer at Fax # [phone].

## 2015-11-13 ENCOUNTER — Telehealth: Payer: Self-pay | Admitting: Infectious Disease

## 2015-11-13 NOTE — Telephone Encounter (Signed)
Patient desired letter to allow him to go back to work. I cannot find where the letter functionality to epic is

## 2015-11-13 NOTE — Telephone Encounter (Signed)
I do not see where I can write a letter in Epic

## 2015-11-14 LAB — SPECIMEN STATUS REPORT

## 2015-11-14 LAB — HGB A1C W/O EAG: HEMOGLOBIN A1C: 5.8 % — AB (ref 4.8–5.6)

## 2015-11-16 ENCOUNTER — Encounter: Payer: Self-pay | Admitting: Infectious Disease

## 2015-12-02 ENCOUNTER — Encounter: Payer: Self-pay | Admitting: Family Medicine

## 2015-12-13 ENCOUNTER — Ambulatory Visit (INDEPENDENT_AMBULATORY_CARE_PROVIDER_SITE_OTHER): Payer: Commercial Managed Care - PPO | Admitting: Family Medicine

## 2015-12-13 DIAGNOSIS — K750 Abscess of liver: Secondary | ICD-10-CM

## 2015-12-13 DIAGNOSIS — Z452 Encounter for adjustment and management of vascular access device: Secondary | ICD-10-CM | POA: Diagnosis not present

## 2015-12-13 DIAGNOSIS — Z5181 Encounter for therapeutic drug level monitoring: Secondary | ICD-10-CM

## 2016-01-22 ENCOUNTER — Ambulatory Visit: Payer: Commercial Managed Care - PPO | Admitting: Family Medicine

## 2016-01-25 ENCOUNTER — Encounter: Payer: Self-pay | Admitting: Family Medicine

## 2017-03-25 IMAGING — CT CT ABD-PELV W/ CM
2 of 5 series · 11 of 36 positions shown, 13 images · IV contrast (APPLIED)
Comparison: Chest radiograph 10/08/2015

CLINICAL DATA: 49-year-old with cough and congestion. Fatigue and
decreased appetite. Opacity in left upper lobe on recent chest
radiograph.

EXAM:
CT CHEST, ABDOMEN, AND PELVIS WITH CONTRAST
TECHNIQUE: Multidetector CT imaging of the chest, abdomen and pelvis was
performed following the standard protocol during bolus
administration of intravenous contrast.
CONTRAST:  25mL OMNIPAQUE IOHEXOL 300 MG/ML SOLN, 100mL OMNIPAQUE
IOHEXOL 300 MG/ML SOLN

[Series 2: cap with 2 · axial · 0.82mm/px · z∈[-658,-68]mm · 8 of 136 slices shown, 10 images]
[im 9/136  mediastinal]
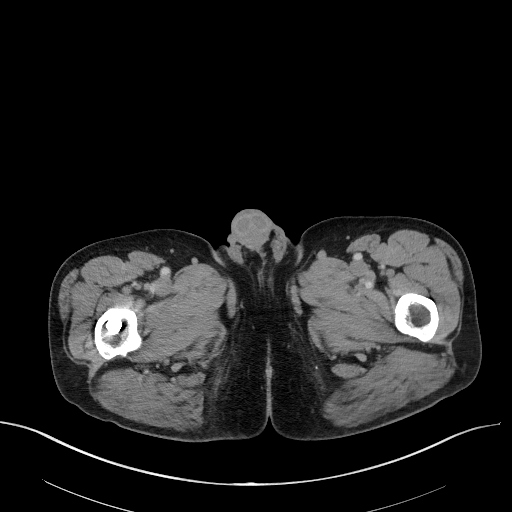
[im 9/136  lung]
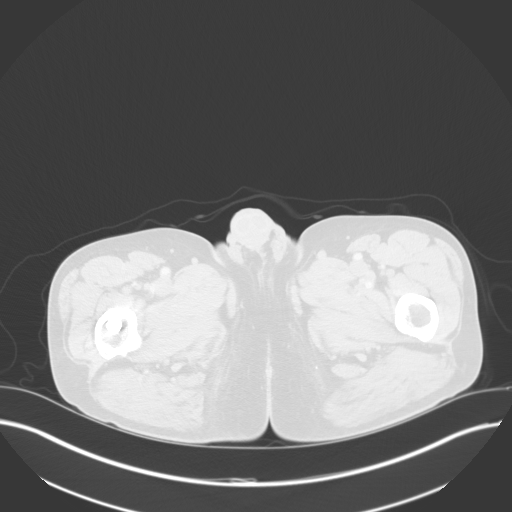
[im 26/136  lung]
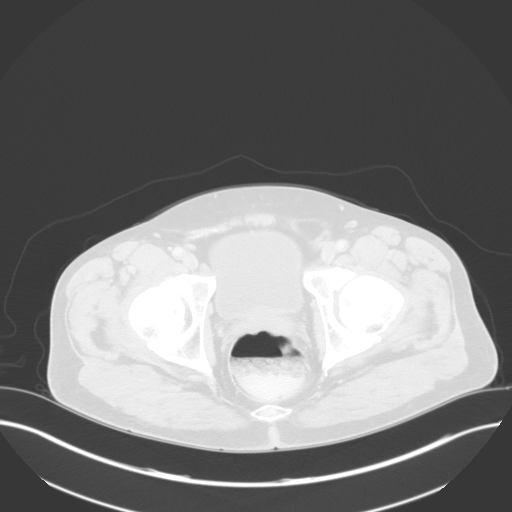
[im 43/136  lung]
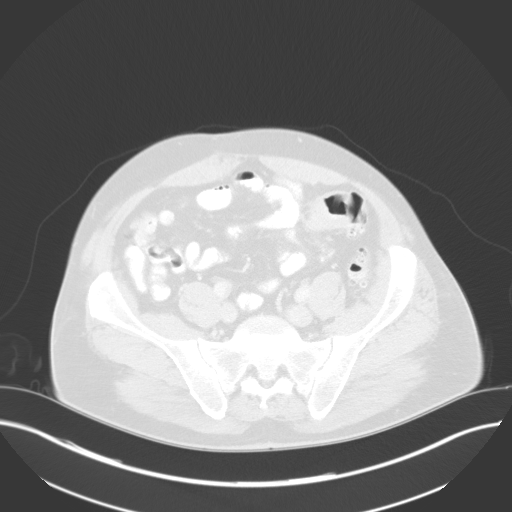
[im 60/136  lung]
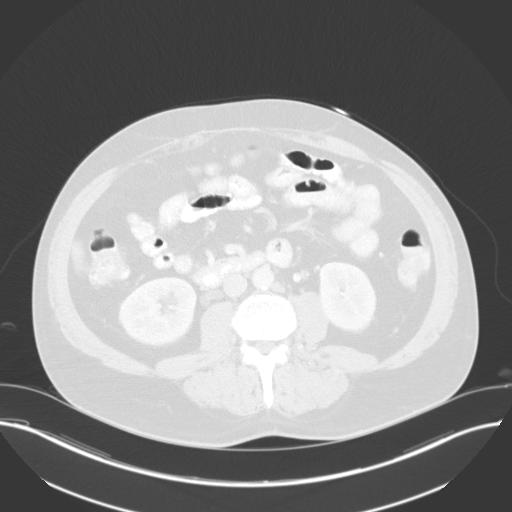
[im 76/136  mediastinal]
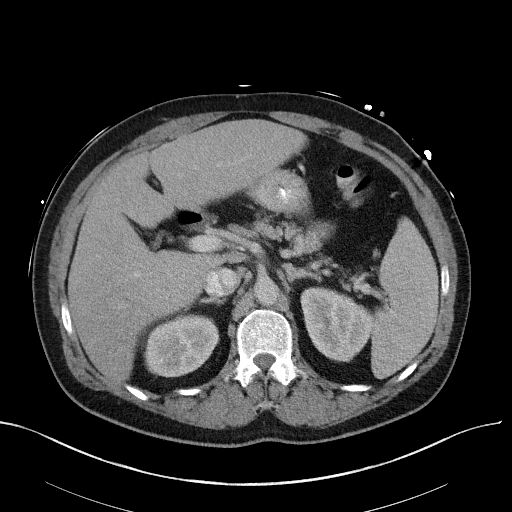
[im 76/136  lung]
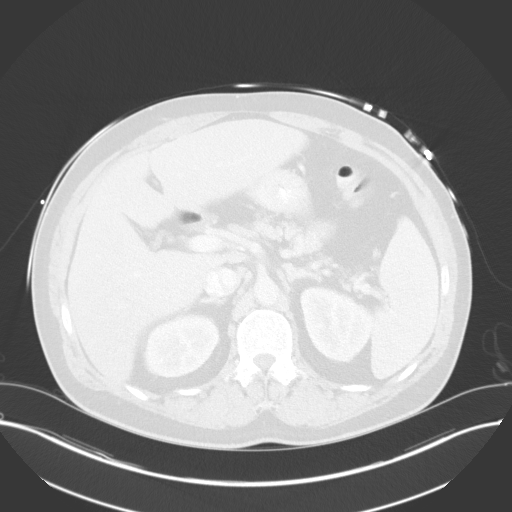
[im 93/136  lung]
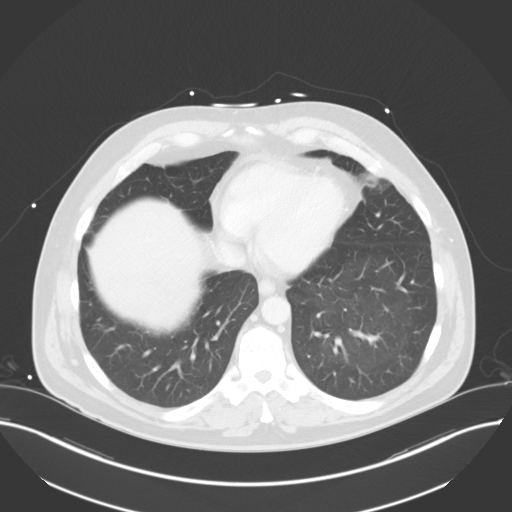
[im 110/136  lung]
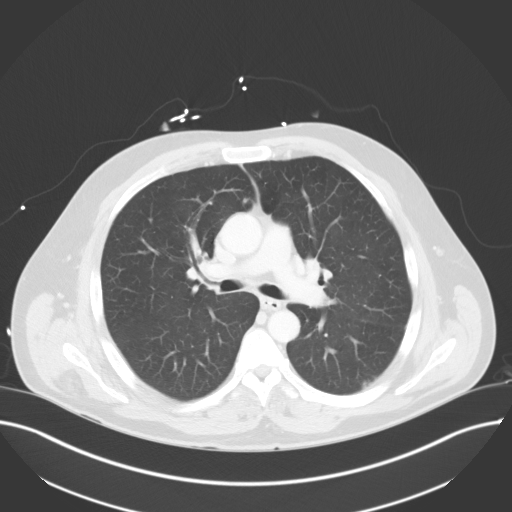
[im 127/136  lung]
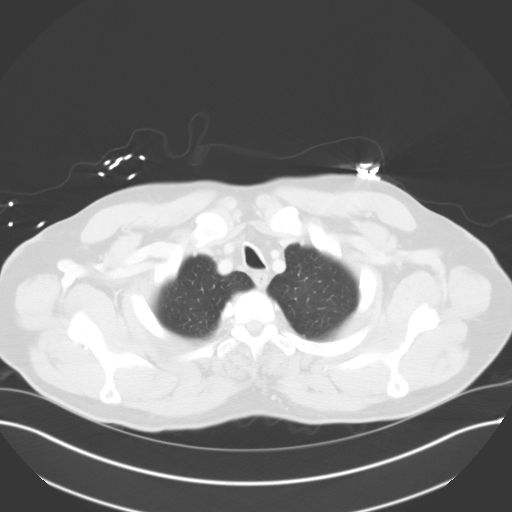

[Series 5: coronals · coronal · 0.72mm/px · 3 of 152 slices shown]
[im 31/152  lung]
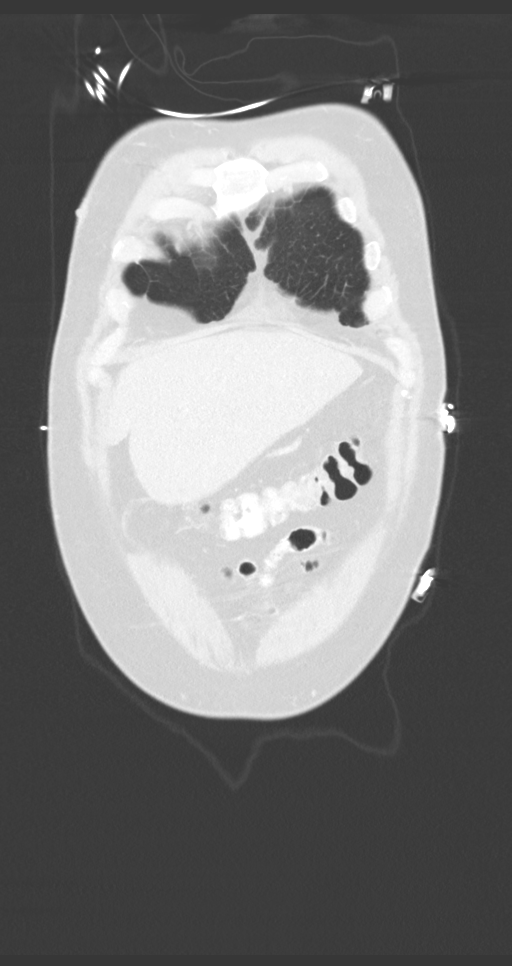
[im 61/152  lung]
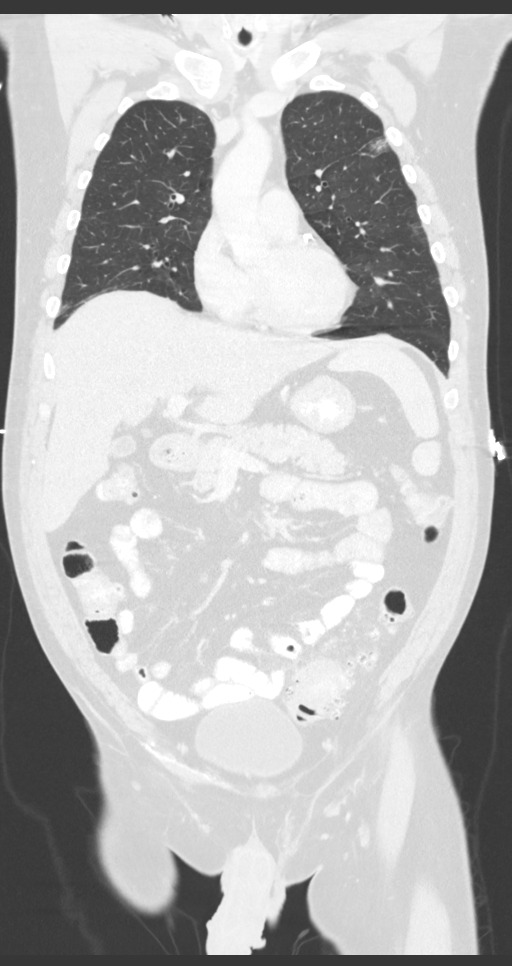
[im 91/152  lung]
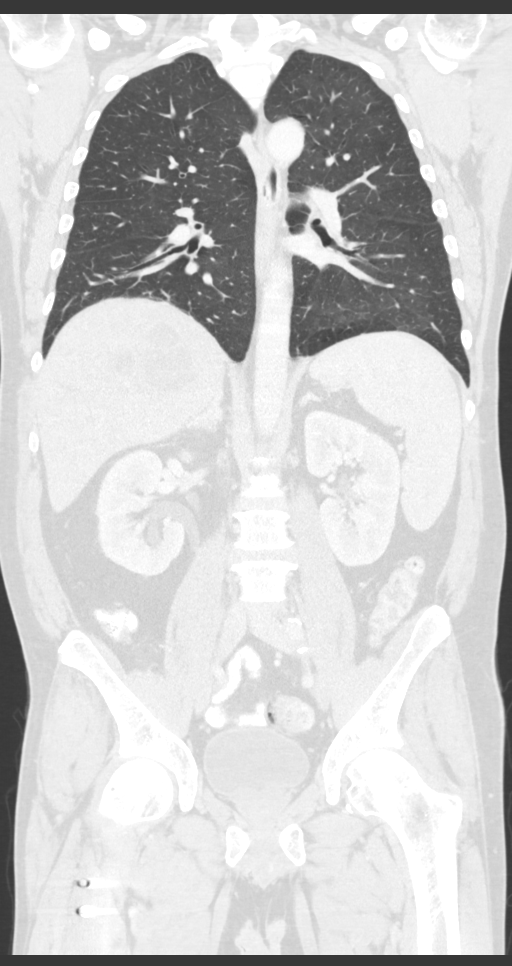

[11 of 36 positions shown; findings below may reference images not displayed]

FINDINGS: CT CHEST FINDINGS

Mediastinum/Lymph Nodes: Left anterior descending coronary artery is
heavily calcified. There is no significant pericardial fluid. Small
lymph nodes throughout the mediastinum. Overall, no significant
chest lymphadenopathy. No axillary lymphadenopathy. Normal caliber
of the thoracic aorta.

Lungs/Pleura: The trachea and mainstem bronchi are patent. There is
a focal irregular opacity at the lingula measured 2.7 cm on sequence
4, image 47. Few subtle peripheral densities in left upper lobe.
Focal parenchymal densities along the bronchovascular distribution
in the left upper lobe. This lesion accounts for the recent chest
radiograph finding. There is also a subtle pleural-based opacity in
the superior segment of left lower lobe on sequence 4, image 28.

Musculoskeletal: No acute bone abnormality. No suspicious bone
findings.

CT ABDOMEN PELVIS FINDINGS

Hepatobiliary: There are multiple low-density structures along the
posterior medial aspect of the right hepatic lobe. In addition,
there is low-density in the surrounding right hepatic lobe.
Difficult to measure this process but it roughly measures 5.9 x
x 8.1 cm. Normal appearance the left hepatic lobe. Main portal
venous system is patent. Normal appearance of the gallbladder.

Pancreas: Normal appearance of pancreas without inflammation or duct
dilatation.

Spleen: Normal appearance of spleen without enlargement.

Adrenals/Urinary Tract: Normal adrenal glands. Evidence for a bifid
right renal collecting system. There is mild-moderate dilatation of
the proximal right ureters. There is mild dilatation of the distal
right ureter. No evidence for an obstructing stone. Small fluid in
the urinary bladder without gross abnormality. No acute inflammatory
changes involving the kidneys.

Stomach/Bowel: There is oral contrast in the small bowel and colon.
There is extensive diverticulosis involving the left colon.
Asymmetric wall thickening involving the proximal sigmoid colon on
sequence 2, image 97. In addition, there is focal wall thickening in
the left colon at the junction of the sigmoid colon and descending
colon on sequence 2, image 89 that measures up to 2.0 cm. No
significant pericolonic inflammation. No acute abnormality to the
stomach or small bowel.

Vascular/Lymphatic: Few atherosclerotic calcifications of the
abdominal aorta without aneurysm. Plaque involving the external
iliac arteries bilaterally. There is no significant abdominal or
pelvic lymphadenopathy. Small retroperitoneal lymph nodes along the
left side of the aorta.

Reproductive: Calcifications in the prostate without significant
enlargement. No free fluid. No free air. Small periumbilical hernia
containing fat.

Other: Intramedullary nail with screw fixation in the proximal right
femur. No suspicious bone findings.

Musculoskeletal:  No suspicious bone lesions identified.
IMPRESSION: There are scattered opacities in the left lung, largest in the left
upper lobe. Based on the distribution of disease, favor an
infectious etiology. However, the rounded opacity in the lingula
also raises concern for a neoplastic process. Recommend follow-up
after antibiotic therapy to ensure resolution of these lung
findings.

Large complex lesion involving the right hepatic lobe. This lesion
is multiloculated with low-density or cystic areas. Findings are
concerning for a complex hepatic abscess. A neoplastic process
cannot be excluded. The extent of this liver lesion could be better
characterized with MRI, with and without contrast.

Areas of asymmetric wall thickening involving the left colon at the
junction of the descending colon and sigmoid colon. Findings could
represent areas of diverticulitis but there is no significant
pericolonic inflammation. Colonic neoplasms are also in the
differential diagnosis.

Bifid right renal collecting system with dilatation of the right
ureters. There is also mild dilatation to the distal right ureter.
The right hydronephrosis could be chronic because there is no
significant inflammatory changes in this area. However, a distal
right ureter lesion cannot be excluded.

These results were called by telephone at the time of interpretation
on 10/08/2015 at [DATE] to Dr. Rimbertas , who verbally acknowledged
these results.

## 2017-03-26 IMAGING — CT CT IMAGE GUIDED DRAINAGE BY PERCUTANEOUS CATHETER
1 of 2 series · 13 of 32 positions shown, 19 images · non-contrast
Comparison: none

ADDENDUM:
Nursing monitored the patient during sedation.
CLINICAL DATA: Liver abscess

[Series 2: i-spiral 5.0 b40f · axial · 0.76mm/px · z∈[+921,+1085]mm · 13 of 55 slices shown, 19 images]
[im 4/55  soft-tissue]
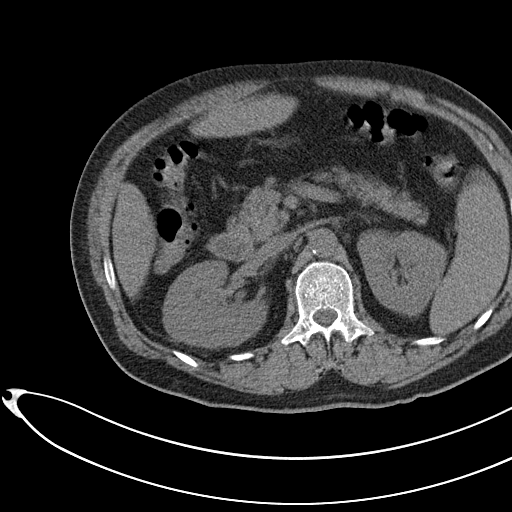
[im 4/55  bone]
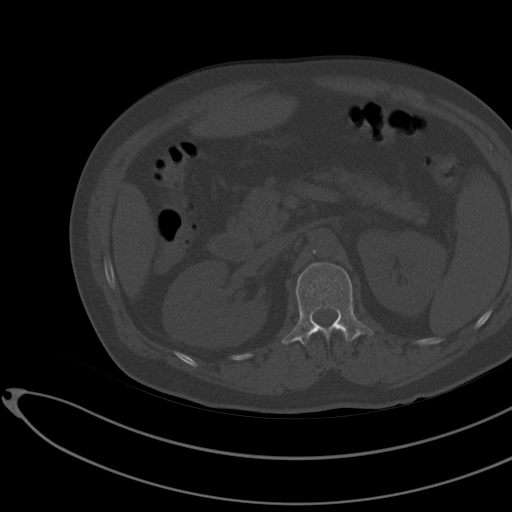
[im 8/55  soft-tissue]
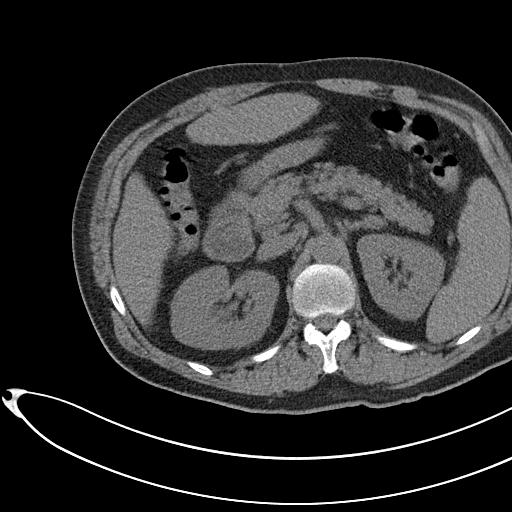
[im 12/55  soft-tissue]
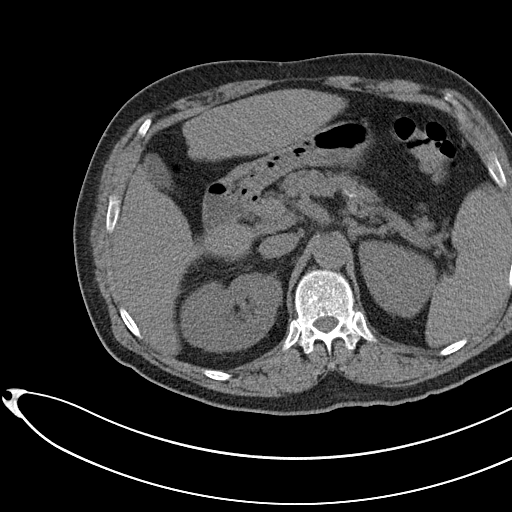
[im 16/55  soft-tissue]
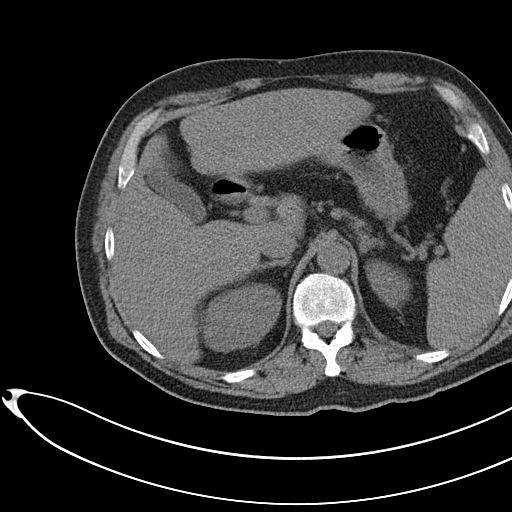
[im 20/55  soft-tissue]
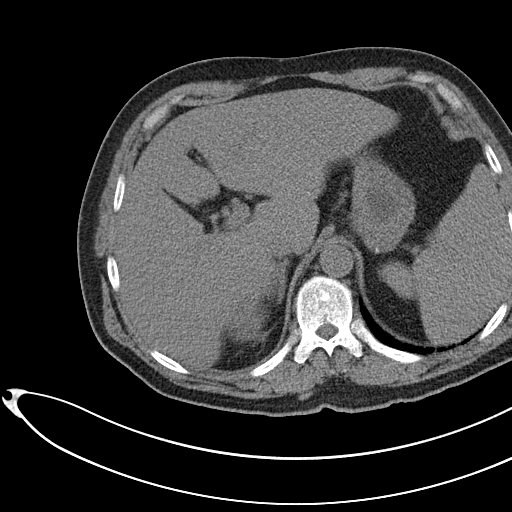
[im 24/55  soft-tissue]
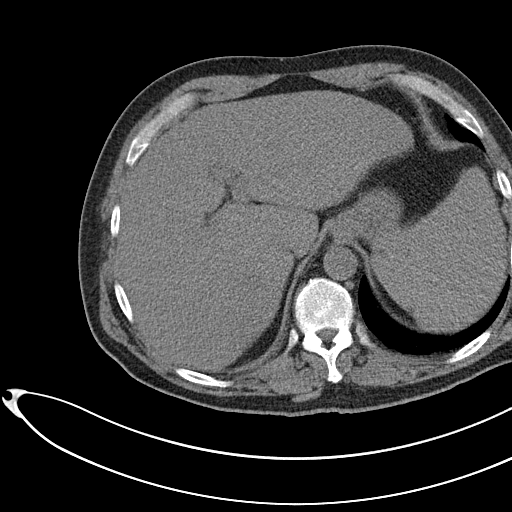
[im 28/55  soft-tissue]
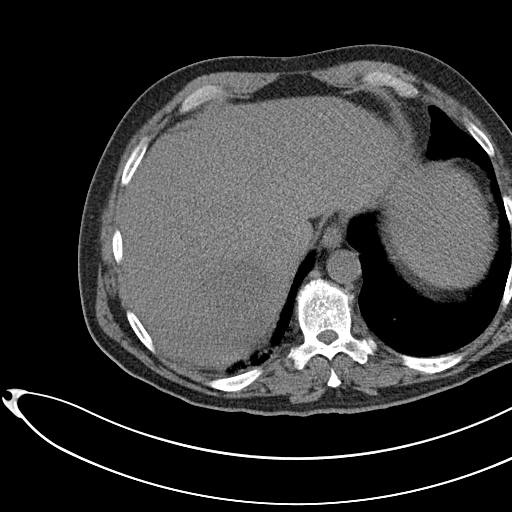
[im 31/55  soft-tissue]
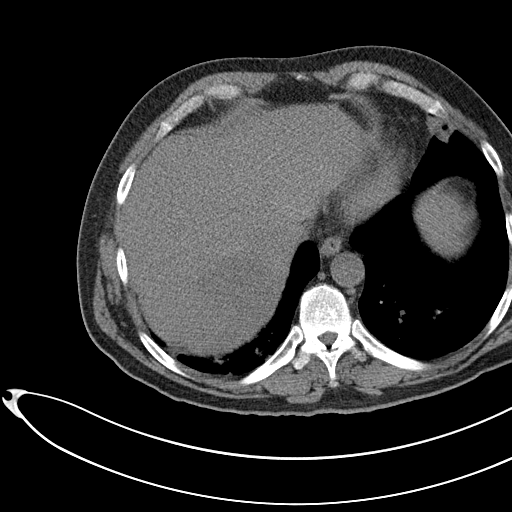
[im 35/55  soft-tissue]
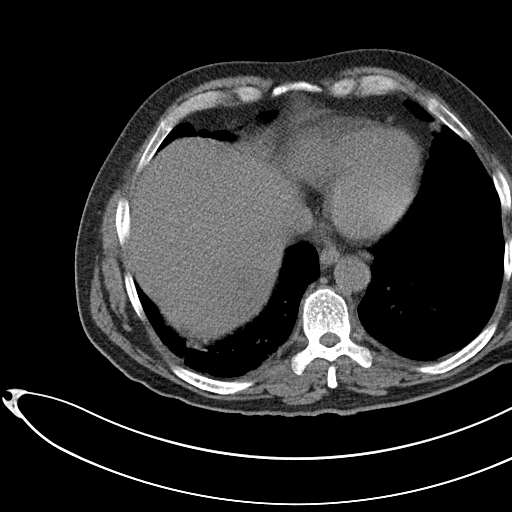
[im 35/55  bone]
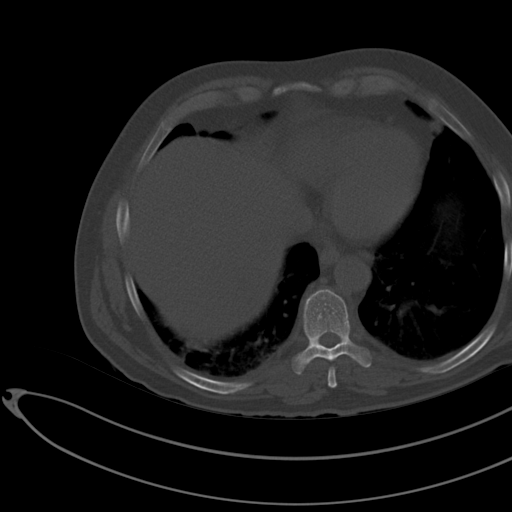
[im 39/55  soft-tissue]
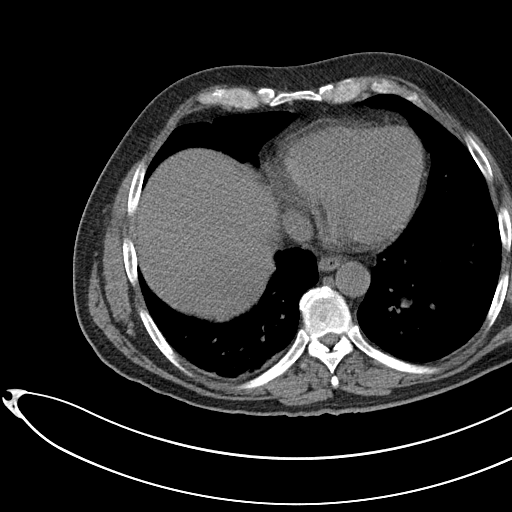
[im 39/55  lung]
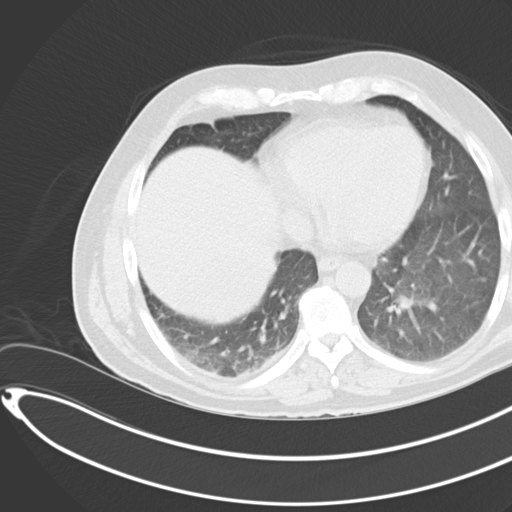
[im 43/55  soft-tissue]
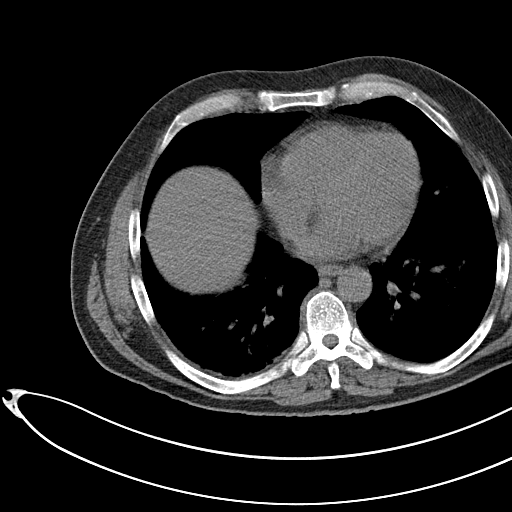
[im 43/55  lung]
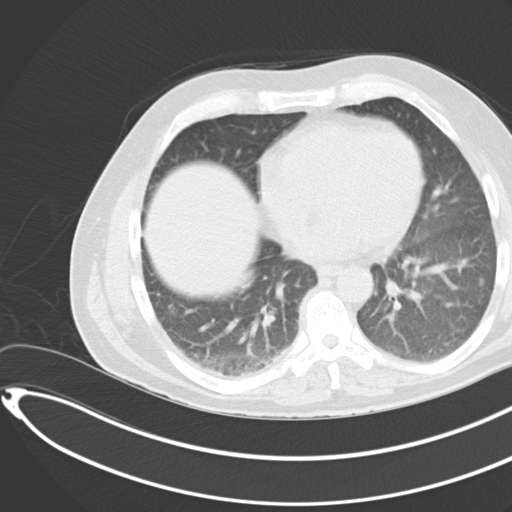
[im 47/55  soft-tissue]
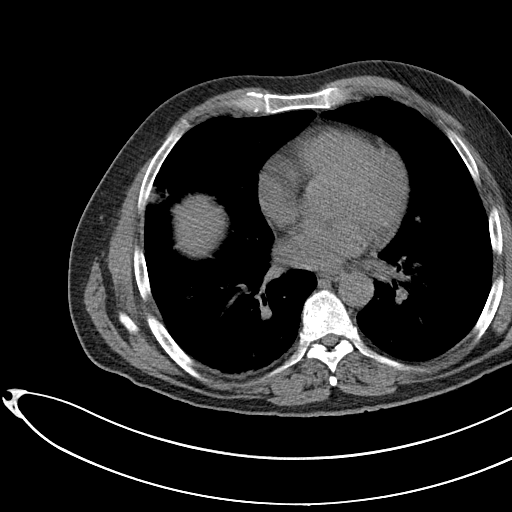
[im 47/55  lung]
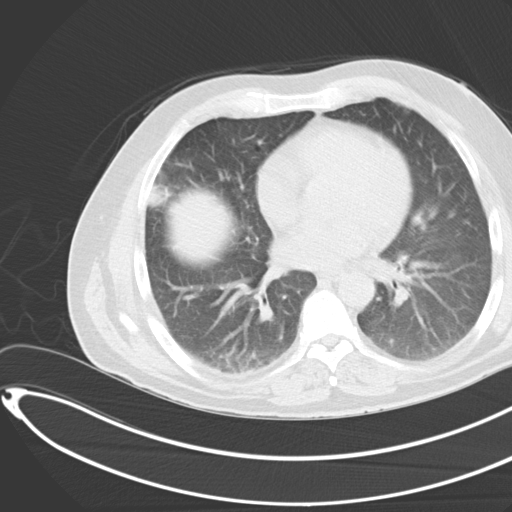
[im 51/55  soft-tissue]
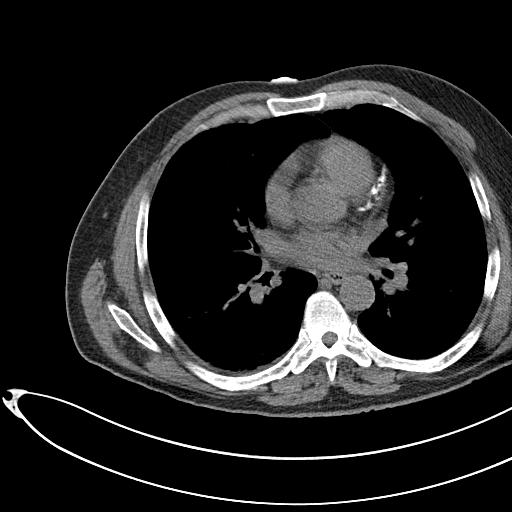
[im 51/55  lung]
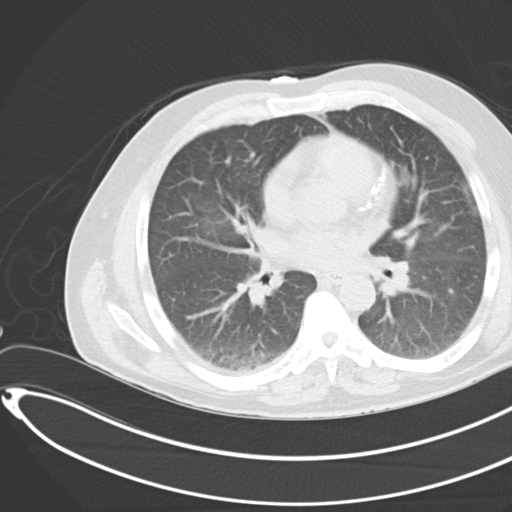

[13 of 32 positions shown; findings below may reference images not displayed]

EXAM:
CT IMAGE GUIDED DRAINAGE BY PERCUTANEOUS CATHETER

FLUOROSCOPY TIME:  None

MEDICATIONS AND MEDICAL HISTORY:
Versed 3 mg, Fentanyl 200 mcg.

Additional Medications: None.

ANESTHESIA/SEDATION:
Moderate sedation time: 15 minutes

CONTRAST:  None

PROCEDURE:
The procedure, risks, benefits, and alternatives were explained to
the patient. Questions regarding the procedure were encouraged and
answered. The patient understands and consents to the procedure.

The right flank was prepped with Betadine in a sterile fashion, and
a sterile drape was applied covering the operative field. A sterile
gown and sterile gloves were used for the procedure.

Under CT guidance, an 18 gauge needle was inserted into the liver
abscess via right intercostal approach. It was removed over an
Amplatz wire. Ten French dilator followed by a 10 French drain were
inserted. Frank pus was aspirated. It was looped and string fixed
then sewn to the skin.
FINDINGS: Imaging demonstrates placement of a 10 French abscess strain into a
liver abscess.

COMPLICATIONS:
None
IMPRESSION: Successful 10 French liver abscess drain.

## 2020-10-30 ENCOUNTER — Encounter: Payer: Self-pay | Admitting: Internal Medicine

## 2022-08-15 DEATH — deceased
